# Patient Record
Sex: Female | Born: 2000
Health system: Southern US, Community
[De-identification: ages and names within clinical notes are randomized; demographics above are authoritative.]

## PROBLEM LIST (undated history)

## (undated) DIAGNOSIS — L0291 Cutaneous abscess, unspecified: Secondary | ICD-10-CM

## (undated) DIAGNOSIS — N83209 Unspecified ovarian cyst, unspecified side: Secondary | ICD-10-CM

## (undated) DIAGNOSIS — W19XXXA Unspecified fall, initial encounter: Secondary | ICD-10-CM

## (undated) DIAGNOSIS — N83201 Unspecified ovarian cyst, right side: Secondary | ICD-10-CM

## (undated) DIAGNOSIS — J45909 Unspecified asthma, uncomplicated: Secondary | ICD-10-CM

## (undated) DIAGNOSIS — N83202 Unspecified ovarian cyst, left side: Secondary | ICD-10-CM

## (undated) HISTORY — DX: Unspecified ovarian cyst, right side: N83.201

## (undated) HISTORY — DX: Unspecified ovarian cyst, left side: N83.202

## (undated) HISTORY — DX: Unspecified ovarian cyst, unspecified side: N83.209

## (undated) HISTORY — DX: Unspecified fall, initial encounter: W19.XXXA

---
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
## 2018-01-24 ENCOUNTER — Ambulatory Visit (INDEPENDENT_AMBULATORY_CARE_PROVIDER_SITE_OTHER): Payer: BLUE CROSS/BLUE SHIELD | Admitting: Adult Health

## 2018-01-24 ENCOUNTER — Encounter: Payer: Self-pay | Admitting: Adult Health

## 2018-01-24 VITALS — BP 111/67 | HR 95 | Ht 67.25 in | Wt 141.6 lb

## 2018-01-24 DIAGNOSIS — J351 Hypertrophy of tonsils: Secondary | ICD-10-CM | POA: Diagnosis not present

## 2018-01-24 DIAGNOSIS — Z0000 Encounter for general adult medical examination without abnormal findings: Secondary | ICD-10-CM

## 2018-01-24 DIAGNOSIS — J312 Chronic pharyngitis: Secondary | ICD-10-CM | POA: Insufficient documentation

## 2018-01-24 MED ORDER — ALBUTEROL SULFATE HFA 108 (90 BASE) MCG/ACT IN AERS: 2.0000 | INHALATION_SPRAY | Freq: Four times a day (QID) | RESPIRATORY_TRACT | 2 refills | Status: AC | PRN

## 2018-01-24 MED ORDER — BECLOMETHASONE DIPROPIONATE 80 MCG/ACT IN AERS: 2.0000 | INHALATION_SPRAY | Freq: Two times a day (BID) | RESPIRATORY_TRACT | 2 refills | Status: AC

## 2018-01-24 NOTE — Patient Instructions (Signed)
Preventive Care for Adults, Female  A healthy lifestyle and preventive care can promote health and wellness. Preventive health guidelines for women include the following key practices.   A routine yearly physical is a good way to check with your health care provider about your health and preventive screening. It is a chance to share any concerns and updates on your health and to receive a thorough exam.   Visit your dentist for a routine exam and preventive care every 6 months. Brush your teeth twice a day and floss once a day. Good oral hygiene prevents tooth decay and gum disease.   The frequency of eye exams is based on your age, health, family medical history, use of contact lenses, and other factors. Follow your health care provider's recommendations for frequency of eye exams.   Eat a healthy diet. Foods like vegetables, fruits, whole grains, low-fat dairy products, and lean protein foods contain the nutrients you need without too many calories. Decrease your intake of foods high in solid fats, added sugars, and salt. Eat the right amount of calories for you.Get information about a proper diet from your health care provider, if necessary.   Regular physical exercise is one of the most important things you can do for your health. Most adults should get at least 150 minutes of moderate-intensity exercise (any activity that increases your heart rate and causes you to sweat) each week. In addition, most adults need muscle-strengthening exercises on 2 or more days a week.   Maintain a healthy weight. The body mass index (BMI) is a screening tool to identify possible weight problems. It provides an estimate of body fat based on height and weight. Your health care provider can find your BMI, and can help you achieve or maintain a healthy weight.For adults 20 years and older:   - A BMI below 18.5 is considered underweight.   - A BMI of 18.5 to 24.9 is normal.   - A BMI of 25 to 29.9 is  considered overweight.   - A BMI of 30 and above is considered obese.   Maintain normal blood lipids and cholesterol levels by exercising and minimizing your intake of trans and saturated fats.  Eat a balanced diet with plenty of fruit and vegetables. Blood tests for lipids and cholesterol should begin at age 20 and be repeated every 5 years minimum.  If your lipid or cholesterol levels are high, you are over 40, or you are at high risk for heart disease, you may need your cholesterol levels checked more frequently.Ongoing high lipid and cholesterol levels should be treated with medicines if diet and exercise are not working.   If you smoke, find out from your health care provider how to quit. If you do not use tobacco, do not start.   Lung cancer screening is recommended for adults aged 55-80 years who are at high risk for developing lung cancer because of a history of smoking. A yearly low-dose CT scan of the lungs is recommended for people who have at least a 30-pack-year history of smoking and are a current smoker or have quit within the past 15 years. A pack year of smoking is smoking an average of 1 pack of cigarettes a day for 1 year (for example: 1 pack a day for 30 years or 2 packs a day for 15 years). Yearly screening should continue until the smoker has stopped smoking for at least 15 years. Yearly screening should be stopped for people who develop a   health problem that would prevent them from having lung cancer treatment.   If you are pregnant, do not drink alcohol. If you are breastfeeding, be very cautious about drinking alcohol. If you are not pregnant and choose to drink alcohol, do not have more than 1 drink per day. One drink is considered to be 12 ounces (355 mL) of beer, 5 ounces (148 mL) of wine, or 1.5 ounces (44 mL) of liquor.   Avoid use of street drugs. Do not share needles with anyone. Ask for help if you need support or instructions about stopping the use of  drugs.   High blood pressure causes heart disease and increases the risk of stroke. Your blood pressure should be checked at least yearly.  Ongoing high blood pressure should be treated with medicines if weight loss and exercise do not work.   If you are 69-55 years old, ask your health care provider if you should take aspirin to prevent strokes.   Diabetes screening involves taking a blood sample to check your fasting blood sugar level. This should be done once every 3 years, after age 38, if you are within normal weight and without risk factors for diabetes. Testing should be considered at a younger age or be carried out more frequently if you are overweight and have at least 1 risk factor for diabetes.   Breast cancer screening is essential preventive care for women. You should practice "breast self-awareness."  This means understanding the normal appearance and feel of your breasts and may include breast self-examination.  Any changes detected, no matter how small, should be reported to a health care provider.  Women in their 80s and 30s should have a clinical breast exam (CBE) by a health care provider as part of a regular health exam every 1 to 3 years.  After age 66, women should have a CBE every year.  Starting at age 1, women should consider having a mammogram (breast X-ray test) every year.  Women who have a family history of breast cancer should talk to their health care provider about genetic screening.  Women at a high risk of breast cancer should talk to their health care providers about having an MRI and a mammogram every year.   -Breast cancer gene (BRCA)-related cancer risk assessment is recommended for women who have family members with BRCA-related cancers. BRCA-related cancers include breast, ovarian, tubal, and peritoneal cancers. Having family members with these cancers may be associated with an increased risk for harmful changes (mutations) in the breast cancer genes BRCA1 and  BRCA2. Results of the assessment will determine the need for genetic counseling and BRCA1 and BRCA2 testing.   The Pap test is a screening test for cervical cancer. A Pap test can show cell changes on the cervix that might become cervical cancer if left untreated. A Pap test is a procedure in which cells are obtained and examined from the lower end of the uterus (cervix).   - Women should have a Pap test starting at age 57.   - Between ages 90 and 70, Pap tests should be repeated every 2 years.   - Beginning at age 63, you should have a Pap test every 3 years as long as the past 3 Pap tests have been normal.   - Some women have medical problems that increase the chance of getting cervical cancer. Talk to your health care provider about these problems. It is especially important to talk to your health care provider if a  new problem develops soon after your last Pap test. In these cases, your health care provider may recommend more frequent screening and Pap tests.   - The above recommendations are the same for women who have or have not gotten the vaccine for human papillomavirus (HPV).   - If you had a hysterectomy for a problem that was not cancer or a condition that could lead to cancer, then you no longer need Pap tests. Even if you no longer need a Pap test, a regular exam is a good idea to make sure no other problems are starting.   - If you are between ages 36 and 66 years, and you have had normal Pap tests going back 10 years, you no longer need Pap tests. Even if you no longer need a Pap test, a regular exam is a good idea to make sure no other problems are starting.   - If you have had past treatment for cervical cancer or a condition that could lead to cancer, you need Pap tests and screening for cancer for at least 20 years after your treatment.   - If Pap tests have been discontinued, risk factors (such as a new sexual partner) need to be reassessed to determine if screening should  be resumed.   - The HPV test is an additional test that may be used for cervical cancer screening. The HPV test looks for the virus that can cause the cell changes on the cervix. The cells collected during the Pap test can be tested for HPV. The HPV test could be used to screen women aged 70 years and older, and should be used in women of any age who have unclear Pap test results. After the age of 67, women should have HPV testing at the same frequency as a Pap test.   Colorectal cancer can be detected and often prevented. Most routine colorectal cancer screening begins at the age of 57 years and continues through age 26 years. However, your health care provider may recommend screening at an earlier age if you have risk factors for colon cancer. On a yearly basis, your health care provider may provide home test kits to check for hidden blood in the stool.  Use of a small camera at the end of a tube, to directly examine the colon (sigmoidoscopy or colonoscopy), can detect the earliest forms of colorectal cancer. Talk to your health care provider about this at age 23, when routine screening begins. Direct exam of the colon should be repeated every 5 -10 years through age 49 years, unless early forms of pre-cancerous polyps or small growths are found.   People who are at an increased risk for hepatitis B should be screened for this virus. You are considered at high risk for hepatitis B if:  -You were born in a country where hepatitis B occurs often. Talk with your health care provider about which countries are considered high risk.  - Your parents were born in a high-risk country and you have not received a shot to protect against hepatitis B (hepatitis B vaccine).  - You have HIV or AIDS.  - You use needles to inject street drugs.  - You live with, or have sex with, someone who has Hepatitis B.  - You get hemodialysis treatment.  - You take certain medicines for conditions like cancer, organ  transplantation, and autoimmune conditions.   Hepatitis C blood testing is recommended for all people born from [birth year] through 1965 and any individual  with known risks for hepatitis C.   Practice safe sex. Use condoms and avoid high-risk sexual practices to reduce the spread of sexually transmitted infections (STIs). STIs include gonorrhea, chlamydia, syphilis, trichomonas, herpes, HPV, and human immunodeficiency virus (HIV). Herpes, HIV, and HPV are viral illnesses that have no cure. They can result in disability, cancer, and death. Sexually active women aged 25 years and younger should be checked for chlamydia. Older women with new or multiple partners should also be tested for chlamydia. Testing for other STIs is recommended if you are sexually active and at increased risk.   Osteoporosis is a disease in which the bones lose minerals and strength with aging. This can result in serious bone fractures or breaks. The risk of osteoporosis can be identified using a bone density scan. Women ages 65 years and over and women at risk for fractures or osteoporosis should discuss screening with their health care providers. Ask your health care provider whether you should take a calcium supplement or vitamin D to There are also several preventive steps women can take to avoid osteoporosis and resulting fractures or to keep osteoporosis from worsening. -->Recommendations include:  Eat a balanced diet high in fruits, vegetables, calcium, and vitamins.  Get enough calcium. The recommended total intake of is 1,200 mg daily; for best absorption, if taking supplements, divide doses into 250-500 mg doses throughout the day. Of the two types of calcium, calcium carbonate is best absorbed when taken with food but calcium citrate can be taken on an empty stomach.  Get enough vitamin D. NAMS and the National Osteoporosis Foundation recommend at least 1,000 IU per day for women age 50 and over who are at risk of vitamin D  deficiency. Vitamin D deficiency can be caused by inadequate sun exposure (for example, those who live in northern latitudes).  Avoid alcohol and smoking. Heavy alcohol intake (more than 7 drinks per week) increases the risk of falls and hip fracture and women smokers tend to lose bone more rapidly and have lower bone mass than nonsmokers. Stopping smoking is one of the most important changes women can make to improve their health and decrease risk for disease.  Be physically active every day. Weight-bearing exercise (for example, fast walking, hiking, jogging, and weight training) may strengthen bones or slow the rate of bone loss that comes with aging. Balancing and muscle-strengthening exercises can reduce the risk of falling and fracture.  Consider therapeutic medications. Currently, several types of effective drugs are available. Healthcare providers can recommend the type most appropriate for each woman.  Eliminate environmental factors that may contribute to accidents. Falls cause nearly 90% of all osteoporotic fractures, so reducing this risk is an important bone-health strategy. Measures include ample lighting, removing obstructions to walking, using nonskid rugs on floors, and placing mats and/or grab bars in showers.  Be aware of medication side effects. Some common medicines make bones weaker. These include a type of steroid drug called glucocorticoids used for arthritis and asthma, some antiseizure drugs, certain sleeping pills, treatments for endometriosis, and some cancer drugs. An overactive thyroid gland or using too much thyroid hormone for an underactive thyroid can also be a problem. If you are taking these medicines, talk to your doctor about what you can do to help protect your bones.reduce the rate of osteoporosis.    Menopause can be associated with physical symptoms and risks. Hormone replacement therapy is available to decrease symptoms and risks. You should talk to your  health care provider   about whether hormone replacement therapy is right for you.   Use sunscreen. Apply sunscreen liberally and repeatedly throughout the day. You should seek shade when your shadow is shorter than you. Protect yourself by wearing long sleeves, pants, a wide-brimmed hat, and sunglasses year round, whenever you are outdoors.   Once a month, do a whole body skin exam, using a mirror to look at the skin on your back. Tell your health care provider of new moles, moles that have irregular borders, moles that are larger than a pencil eraser, or moles that have changed in shape or color.   -Stay current with required vaccines (immunizations).   Influenza vaccine. All adults should be immunized every year.  Tetanus, diphtheria, and acellular pertussis (Td, Tdap) vaccine. Pregnant women should receive 1 dose of Tdap vaccine during each pregnancy. The dose should be obtained regardless of the length of time since the last dose. Immunization is preferred during the 27th 36th week of gestation. An adult who has not previously received Tdap or who does not know her vaccine status should receive 1 dose of Tdap. This initial dose should be followed by tetanus and diphtheria toxoids (Td) booster doses every 10 years. Adults with an unknown or incomplete history of completing a 3-dose immunization series with Td-containing vaccines should begin or complete a primary immunization series including a Tdap dose. Adults should receive a Td booster every 10 years.  Varicella vaccine. An adult without evidence of immunity to varicella should receive 2 doses or a second dose if she has previously received 1 dose. Pregnant females who do not have evidence of immunity should receive the first dose after pregnancy. This first dose should be obtained before leaving the health care facility. The second dose should be obtained 4 8 weeks after the first dose.  Human papillomavirus (HPV) vaccine. Females aged 13 26  years who have not received the vaccine previously should obtain the 3-dose series. The vaccine is not recommended for use in pregnant females. However, pregnancy testing is not needed before receiving a dose. If a female is found to be pregnant after receiving a dose, no treatment is needed. In that case, the remaining doses should be delayed until after the pregnancy. Immunization is recommended for any person with an immunocompromised condition through the age of 26 years if she did not get any or all doses earlier. During the 3-dose series, the second dose should be obtained 4 8 weeks after the first dose. The third dose should be obtained 24 weeks after the first dose and 16 weeks after the second dose.  Zoster vaccine. One dose is recommended for adults aged 60 years or older unless certain conditions are present.  Measles, mumps, and rubella (MMR) vaccine. Adults born before 1957 generally are considered immune to measles and mumps. Adults born in 1957 or later should have 1 or more doses of MMR vaccine unless there is a contraindication to the vaccine or there is laboratory evidence of immunity to each of the three diseases. A routine second dose of MMR vaccine should be obtained at least 28 days after the first dose for students attending postsecondary schools, health care workers, or international travelers. People who received inactivated measles vaccine or an unknown type of measles vaccine during 1963 1967 should receive 2 doses of MMR vaccine. People who received inactivated mumps vaccine or an unknown type of mumps vaccine before 1979 and are at high risk for mumps infection should consider immunization with 2 doses of   MMR vaccine. For females of childbearing age, rubella immunity should be determined. If there is no evidence of immunity, females who are not pregnant should be vaccinated. If there is no evidence of immunity, females who are pregnant should delay immunization until after pregnancy.  Unvaccinated health care workers born before [birth year] who lack laboratory evidence of measles, mumps, or rubella immunity or laboratory confirmation of disease should consider measles and mumps immunization with 2 doses of MMR vaccine or rubella immunization with 1 dose of MMR vaccine.  Pneumococcal 13-valent conjugate (PCV13) vaccine. When indicated, a person who is uncertain of her immunization history and has no record of immunization should receive the PCV13 vaccine. An adult aged 54 years or older who has certain medical conditions and has not been previously immunized should receive 1 dose of PCV13 vaccine. This PCV13 should be followed with a dose of pneumococcal polysaccharide (PPSV23) vaccine. The PPSV23 vaccine dose should be obtained at least 8 weeks after the dose of PCV13 vaccine. An adult aged 58 years or older who has certain medical conditions and previously received 1 or more doses of PPSV23 vaccine should receive 1 dose of PCV13. The PCV13 vaccine dose should be obtained 1 or more years after the last PPSV23 vaccine dose.  Pneumococcal polysaccharide (PPSV23) vaccine. When PCV13 is also indicated, PCV13 should be obtained first. All adults aged 58 years and older should be immunized. An adult younger than age 65 years who has certain medical conditions should be immunized. Any person who resides in a nursing home or long-term care facility should be immunized. An adult smoker should be immunized. People with an immunocompromised condition and certain other conditions should receive both PCV13 and PPSV23 vaccines. People with human immunodeficiency virus (HIV) infection should be immunized as soon as possible after diagnosis. Immunization during chemotherapy or radiation therapy should be avoided. Routine use of PPSV23 vaccine is not recommended for American Indians, [place] Natives, or people younger than 65 years unless there are medical conditions that require PPSV23 vaccine. When indicated,  people who have unknown immunization and have no record of immunization should receive PPSV23 vaccine. One-time revaccination 5 years after the first dose of PPSV23 is recommended for people aged 70 64 years who have chronic kidney failure, nephrotic syndrome, asplenia, or immunocompromised conditions. People who received 1 2 doses of PPSV23 before age 32 years should receive another dose of PPSV23 vaccine at age 96 years or later if at least 5 years have passed since the previous dose. Doses of PPSV23 are not needed for people immunized with PPSV23 at or after age 55 years.  Meningococcal vaccine. Adults with asplenia or persistent complement component deficiencies should receive 2 doses of quadrivalent meningococcal conjugate (MenACWY-D) vaccine. The doses should be obtained at least 2 months apart. Microbiologists working with certain meningococcal bacteria, [employer] recruits, people at risk during an outbreak, and people who travel to or live in countries with a high rate of meningitis should be immunized. A first-year college student up through age 58 years who is living in a residence hall should receive a dose if she did not receive a dose on or after her 16th birthday. Adults who have certain high-risk conditions should receive one or more doses of vaccine.  Hepatitis A vaccine. Adults who wish to be protected from this disease, have certain high-risk conditions, work with hepatitis A-infected animals, work in hepatitis A research labs, or travel to or work in countries with a high rate of hepatitis A should be  immunized. Adults who were previously unvaccinated and who anticipate close contact with an international adoptee during the first 60 days after arrival in the [place] from a country with a high rate of hepatitis A should be immunized.  Hepatitis B vaccine.  Adults who wish to be protected from this disease, have certain high-risk conditions, may be exposed to blood or other infectious  body fluids, are household contacts or sex partners of hepatitis B positive people, are clients or workers in certain care facilities, or travel to or work in countries with a high rate of hepatitis B should be immunized.  Haemophilus influenzae type b (Hib) vaccine. A previously unvaccinated person with asplenia or sickle cell disease or having a scheduled splenectomy should receive 1 dose of Hib vaccine. Regardless of previous immunization, a recipient of a hematopoietic stem cell transplant should receive a 3-dose series 6 12 months after her successful transplant. Hib vaccine is not recommended for adults with HIV infection.  Preventive Services / Frequency Ages 6 to 39years  Blood pressure check.** / Every 1 to 2 years.  Lipid and cholesterol check.** / Every 5 years beginning at age 39.  Clinical breast exam.** / Every 3 years for women in their 61s and 62s.  BRCA-related cancer risk assessment.** / For women who have family members with a BRCA-related cancer (breast, ovarian, tubal, or peritoneal cancers).  Pap test.** / Every 2 years from ages 47 through 85. Every 3 years starting at age 34 through age 12 or 74 with a history of 3 consecutive normal Pap tests.  HPV screening.** / Every 3 years from ages 46 through ages 43 to 54 with a history of 3 consecutive normal Pap tests.  Hepatitis C blood test.** / For any individual with known risks for hepatitis C.  Skin self-exam. / Monthly.  Influenza vaccine. / Every year.  Tetanus, diphtheria, and acellular pertussis (Tdap, Td) vaccine.** / Consult your health care provider. Pregnant women should receive 1 dose of Tdap vaccine during each pregnancy. 1 dose of Td every 10 years.  Varicella vaccine.** / Consult your health care provider. Pregnant females who do not have evidence of immunity should receive the first dose after pregnancy.  HPV vaccine. / 3 doses over 6 months, if 64 and younger. The vaccine is not recommended for use in  pregnant females. However, pregnancy testing is not needed before receiving a dose.  Measles, mumps, rubella (MMR) vaccine.** / You need at least 1 dose of MMR if you were born in 1957 or later. You may also need a 2nd dose. For females of childbearing age, rubella immunity should be determined. If there is no evidence of immunity, females who are not pregnant should be vaccinated. If there is no evidence of immunity, females who are pregnant should delay immunization until after pregnancy.  Pneumococcal 13-valent conjugate (PCV13) vaccine.** / Consult your health care provider.  Pneumococcal polysaccharide (PPSV23) vaccine.** / 1 to 2 doses if you smoke cigarettes or if you have certain conditions.  Meningococcal vaccine.** / 1 dose if you are age 71 to 35 years and a Market researcher living in a residence hall, or have one of several medical conditions, you need to get vaccinated against meningococcal disease. You may also need additional booster doses.  Hepatitis A vaccine.** / Consult your health care provider.  Hepatitis B vaccine.** / Consult your health care provider.  Haemophilus influenzae type b (Hib) vaccine.** / Consult your health care provider.  Ages 55 to 64years  Blood pressure check.** / Every 1 to 2 years.  Lipid and cholesterol check.** / Every 5 years beginning at age 20 years.  Lung cancer screening. / Every year if you are aged 55 80 years and have a 30-pack-year history of smoking and currently smoke or have quit within the past 15 years. Yearly screening is stopped once you have quit smoking for at least 15 years or develop a health problem that would prevent you from having lung cancer treatment.  Clinical breast exam.** / Every year after age 40 years.  BRCA-related cancer risk assessment.** / For women who have family members with a BRCA-related cancer (breast, ovarian, tubal, or peritoneal cancers).  Mammogram.** / Every year beginning at age 40  years and continuing for as long as you are in good health. Consult with your health care provider.  Pap test.** / Every 3 years starting at age 30 years through age 65 or 70 years with a history of 3 consecutive normal Pap tests.  HPV screening.** / Every 3 years from ages 30 years through ages 65 to 70 years with a history of 3 consecutive normal Pap tests.  Fecal occult blood test (FOBT) of stool. / Every year beginning at age 50 years and continuing until age 75 years. You may not need to do this test if you get a colonoscopy every 10 years.  Flexible sigmoidoscopy or colonoscopy.** / Every 5 years for a flexible sigmoidoscopy or every 10 years for a colonoscopy beginning at age 50 years and continuing until age 75 years.  Hepatitis C blood test.** / For all people born from 1945 through 1965 and any individual with known risks for hepatitis C.  Skin self-exam. / Monthly.  Influenza vaccine. / Every year.  Tetanus, diphtheria, and acellular pertussis (Tdap/Td) vaccine.** / Consult your health care provider. Pregnant women should receive 1 dose of Tdap vaccine during each pregnancy. 1 dose of Td every 10 years.  Varicella vaccine.** / Consult your health care provider. Pregnant females who do not have evidence of immunity should receive the first dose after pregnancy.  Zoster vaccine.** / 1 dose for adults aged 60 years or older.  Measles, mumps, rubella (MMR) vaccine.** / You need at least 1 dose of MMR if you were born in 1957 or later. You may also need a 2nd dose. For females of childbearing age, rubella immunity should be determined. If there is no evidence of immunity, females who are not pregnant should be vaccinated. If there is no evidence of immunity, females who are pregnant should delay immunization until after pregnancy.  Pneumococcal 13-valent conjugate (PCV13) vaccine.** / Consult your health care provider.  Pneumococcal polysaccharide (PPSV23) vaccine.** / 1 to 2 doses if  you smoke cigarettes or if you have certain conditions.  Meningococcal vaccine.** / Consult your health care provider.  Hepatitis A vaccine.** / Consult your health care provider.  Hepatitis B vaccine.** / Consult your health care provider.  Haemophilus influenzae type b (Hib) vaccine.** / Consult your health care provider.  Ages 65 years and over  Blood pressure check.** / Every 1 to 2 years.  Lipid and cholesterol check.** / Every 5 years beginning at age 20 years.  Lung cancer screening. / Every year if you are aged 55 80 years and have a 30-pack-year history of smoking and currently smoke or have quit within the past 15 years. Yearly screening is stopped once you have quit smoking for at least 15 years or develop a health problem that   would prevent you from having lung cancer treatment.  Clinical breast exam.** / Every year after age 103 years.  BRCA-related cancer risk assessment.** / For women who have family members with a BRCA-related cancer (breast, ovarian, tubal, or peritoneal cancers).  Mammogram.** / Every year beginning at age 36 years and continuing for as long as you are in good health. Consult with your health care provider.  Pap test.** / Every 3 years starting at age 5 years through age 85 or 10 years with 3 consecutive normal Pap tests. Testing can be stopped between 65 and 70 years with 3 consecutive normal Pap tests and no abnormal Pap or HPV tests in the past 10 years.  HPV screening.** / Every 3 years from ages 93 years through ages 70 or 45 years with a history of 3 consecutive normal Pap tests. Testing can be stopped between 65 and 70 years with 3 consecutive normal Pap tests and no abnormal Pap or HPV tests in the past 10 years.  Fecal occult blood test (FOBT) of stool. / Every year beginning at age 8 years and continuing until age 45 years. You may not need to do this test if you get a colonoscopy every 10 years.  Flexible sigmoidoscopy or colonoscopy.** /  Every 5 years for a flexible sigmoidoscopy or every 10 years for a colonoscopy beginning at age 69 years and continuing until age 68 years.  Hepatitis C blood test.** / For all people born from [birth year] through 1967 and any individual with known risks for hepatitis C.  Osteoporosis screening.** / A one-time screening for women ages 7 years and over and women at risk for fractures or osteoporosis.  Skin self-exam. / Monthly.  Influenza vaccine. / Every year.  Tetanus, diphtheria, and acellular pertussis (Tdap/Td) vaccine.** / 1 dose of Td every 10 years.  Varicella vaccine.** / Consult your health care provider.  Zoster vaccine.** / 1 dose for adults aged 5 years or older.  Pneumococcal 13-valent conjugate (PCV13) vaccine.** / Consult your health care provider.  Pneumococcal polysaccharide (PPSV23) vaccine.** / 1 dose for all adults aged 74 years and older.  Meningococcal vaccine.** / Consult your health care provider.  Hepatitis A vaccine.** / Consult your health care provider.  Hepatitis B vaccine.** / Consult your health care provider.  Haemophilus influenzae type b (Hib) vaccine.** / Consult your health care provider. ** Family history and personal history of risk and conditions may change your health care provider's recommendations. Document Released: 07/18/2001 Document Revised: 03/12/2013  [facility] Patient Information 2014 [place], [place].   EXERCISE AND DIET:  We recommended that you start or continue a regular exercise program for good health. Regular exercise means any activity that makes your heart beat faster and makes you sweat.  We recommend exercising at least 30 minutes per day at least 3 days a week, preferably 5.  We also recommend a diet low in fat and sugar / carbohydrates.  Inactivity, poor dietary choices and obesity can cause diabetes, heart attack, stroke, and kidney damage, among others.     ALCOHOL AND SMOKING:  Women should limit their alcohol intake to no  more than 7 drinks/beers/glasses of wine (combined, not each!) per week. Moderation of alcohol intake to this level decreases your risk of breast cancer and liver damage.  ( And of course, no recreational drugs are part of a healthy lifestyle.)  Also, you should not be smoking at all or even being exposed to second hand smoke. Most people know smoking can  cause cancer, and various heart and lung diseases, but did you know it also contributes to weakening of your bones?  Aging of your skin?  Yellowing of your teeth and nails?   CALCIUM AND VITAMIN D:  Adequate intake of calcium and Vitamin D are recommended.  The recommendations for exact amounts of these supplements seem to change often, but generally speaking 600 mg of calcium (either carbonate or citrate) and 800 units of Vitamin D per day seems prudent. Certain women may benefit from higher intake of Vitamin D.  If you are among these women, your doctor will have told you during your visit.     PAP SMEARS:  Pap smears, to check for cervical cancer or precancers,  have traditionally been done yearly, although recent scientific advances have shown that most women can have pap smears less often.  However, every woman still should have a physical exam from her gynecologist or primary care physician every year. It will include a breast check, inspection of the vulva and vagina to check for abnormal growths or skin changes, a visual exam of the cervix, and then an exam to evaluate the size and shape of the uterus and ovaries.  And after 19 years of age, a rectal exam is indicated to check for rectal cancers. We will also provide age appropriate advice regarding health maintenance, like when you should have certain vaccines, screening for sexually transmitted diseases, bone density testing, colonoscopy, mammograms, etc.    MAMMOGRAMS:  All women over 65 years old should have a yearly mammogram. Many facilities now offer a "3D" mammogram, which may cost  around $50 extra out of pocket. If possible,  we recommend you accept the option to have the 3D mammogram performed.  It both reduces the number of women who will be called back for extra views which then turn out to be normal, and it is better than the routine mammogram at detecting truly abnormal areas.     COLONOSCOPY:  Colonoscopy to screen for colon cancer is recommended for all women at age 26.  We know, you hate the idea of the prep.  We agree, BUT, having colon cancer and not knowing it is worse!!  Colon cancer so often starts as a polyp that can be seen and removed at colonscopy, which can quite literally save your life!  And if your first colonoscopy is normal and you have no family history of colon cancer, most women don't have to have it again for 10 years.  Once every ten years, you can do something that may end up saving your life, right?  We will be happy to help you get it scheduled when you are ready.  Be sure to check your insurance coverage so you understand how much it will cost.  It may be covered as a preventative service at no cost, but you should check your particular policy.    Mediterranean Diet A Mediterranean diet refers to food and lifestyle choices that are based on the traditions of countries located on the [employer]. This way of eating has been shown to help prevent certain conditions and improve outcomes for people who have chronic diseases, like kidney disease and heart disease. What are tips for following this plan? Lifestyle  Cook and eat meals together with your family, when possible.  Drink enough fluid to keep your urine clear or pale yellow.  Be physically active every day. This includes: ? Aerobic exercise like running or swimming. ? Leisure activities like  gardening, walking, or housework.  Get 7-8 hours of sleep each night.  If recommended by your health care provider, drink red wine in moderation. This means 1 glass a day for nonpregnant women  and 2 glasses a day for men. A glass of wine equals 5 oz (150 mL). Reading food labels  Check the serving size of packaged foods. For foods such as rice and pasta, the serving size refers to the amount of cooked product, not dry.  Check the total fat in packaged foods. Avoid foods that have saturated fat or trans fats.  Check the ingredients list for added sugars, such as corn syrup. Shopping  At the grocery store, buy most of your food from the areas near the walls of the store. This includes: ? Fresh fruits and vegetables (produce). ? Grains, beans, nuts, and seeds. Some of these may be available in unpackaged forms or large amounts (in bulk). ? Fresh seafood. ? Poultry and eggs. ? Low-fat dairy products.  Buy whole ingredients instead of prepackaged foods.  Buy fresh fruits and vegetables in-season from local farmers markets.  Buy frozen fruits and vegetables in resealable bags.  If you do not have access to quality fresh seafood, buy precooked frozen shrimp or canned fish, such as tuna, salmon, or sardines.  Buy small amounts of raw or cooked vegetables, salads, or olives from the deli or salad bar at your store.  Stock your pantry so you always have certain foods on hand, such as olive oil, canned tuna, canned tomatoes, rice, pasta, and beans. Cooking  Cook foods with extra-virgin olive oil instead of using butter or other vegetable oils.  Have meat as a side dish, and have vegetables or grains as your main dish. This means having meat in small portions or adding small amounts of meat to foods like pasta or stew.  Use beans or vegetables instead of meat in common dishes like chili or lasagna.  Experiment with different cooking methods. Try roasting or broiling vegetables instead of steaming or sauteing them.  Add frozen vegetables to soups, stews, pasta, or rice.  Add nuts or seeds for added healthy fat at each meal. You can add these to yogurt, salads, or vegetable  dishes.  Marinate fish or vegetables using olive oil, lemon juice, garlic, and fresh herbs. Meal planning  Plan to eat 1 vegetarian meal one day each week. Try to work up to 2 vegetarian meals, if possible.  Eat seafood 2 or more times a week.  Have healthy snacks readily available, such as: ? Vegetable sticks with hummus. ? [place] yogurt. ? Fruit and nut trail mix.  Eat balanced meals throughout the week. This includes: ? Fruit: 2-3 servings a day ? Vegetables: 4-5 servings a day ? Low-fat dairy: 2 servings a day ? Fish, poultry, or lean meat: 1 serving a day ? Beans and legumes: 2 or more servings a week ? Nuts and seeds: 1-2 servings a day ? Whole grains: 6-8 servings a day ? Extra-virgin olive oil: 3-4 servings a day  Limit red meat and sweets to only a few servings a month What are my food choices?  Mediterranean diet ? Recommended ? Grains: Whole-grain pasta. Brown rice. Bulgar wheat. Polenta. Couscous. Whole-wheat bread. [place]. ? Vegetables: Artichokes. Beets. Broccoli. Cabbage. Carrots. Eggplant. Green beans. Chard. Kale. Spinach. Onions. Leeks. Peas. Squash. Tomatoes. Peppers. Radishes. ? Fruits: Apples. Apricots. Avocado. Berries. Bananas. Cherries. Dates. Figs. Grapes. Lemons. Melon. Oranges. Peaches. Plums. Pomegranate. ? Meats and other protein  foods: Beans. Almonds. Sunflower seeds. Pine nuts. Peanuts. [place]. Salmon. Scallops. Shrimp. [place]. Tilapia. Clams. Oysters. Eggs. ? Dairy: Low-fat milk. Cheese. Greek yogurt. ? Beverages: Water. Red wine. Herbal tea. ? Fats and oils: Extra virgin olive oil. Avocado oil. Grape seed oil. ? Sweets and desserts: [place] yogurt with honey. Baked apples. Poached pears. Trail mix. ? Seasoning and other foods: Basil. Cilantro. Coriander. Cumin. Mint. Parsley. Sage. Rosemary. Tarragon. Garlic. Oregano. Thyme. Pepper. Balsalmic vinegar. Tahini. Hummus. Tomato sauce. Olives. Mushrooms. ? Limit these ? Grains: Prepackaged pasta or  rice dishes. Prepackaged cereal with added sugar. ? Vegetables: Deep fried potatoes (french fries). ? Fruits: Fruit canned in syrup. ? Meats and other protein foods: Beef. Pork. Lamb. Poultry with skin. Hot dogs. [place] Salines. ? Dairy: Ice cream. Sour cream. Whole milk. ? Beverages: Juice. Sugar-sweetened soft drinks. Beer. Liquor and spirits. ? Fats and oils: Butter. Canola oil. Vegetable oil. Beef fat (tallow). Lard. ? Sweets and desserts: Cookies. Cakes. Pies. Candy. ? Seasoning and other foods: Mayonnaise. Premade sauces and marinades. ? The items listed may not be a complete list. Talk with your dietitian about what dietary choices are right for you. Summary  The Mediterranean diet includes both food and lifestyle choices.  Eat a variety of fresh fruits and vegetables, beans, nuts, seeds, and whole grains.  Limit the amount of red meat and sweets that you eat.  Talk with your health care provider about whether it is safe for you to drink red wine in moderation. This means 1 glass a day for nonpregnant women and 2 glasses a day for men. A glass of wine equals 5 oz (150 mL). This information is not intended to replace advice given to you by your health care provider. Make sure you discuss any questions you have with your health care provider. Document Released: 01/13/2016 Document Revised: 02/15/2016 Document Reviewed: 01/13/2016 Else[employer] Interactive Patient Education  2018 [place] to drink plenty of water, follow Mediterranean diet, and exercise 87mns at least 5 days a week. Refills on inhalers sent to pharmacy in [place]. Recommend annual physical. HAVE A GREAT [employer] YEAR AT SCHOOL! NICE TO SEE YOU!

## 2018-01-24 NOTE — Progress Notes (Signed)
Subjective:    Patient ID: Annette Brown, female    DOB: 06-07-2000, 17 y.o.   MRN: [id number]  HPI: 01/22/18 OV:  Annette Brown is here to establish as a new pt.  She is a very pleasant 17 year old female.   PMH:  Asthma, atypical migraine HA.  During swim season she needs QVAR inhaler almost daily and rescue inhaler once every few weeks.  She denies tobacco/EOTH use.  Her HAs start with visual aura and then nausea that will last for days.  She reports that he mother and maternal side relatives have HAs similar in nature.  She denies CP/dyspnea at rest/palpitations.  She is not sexually active.  She will be starting college this fall to study acting. She feels that her menses have intensified since she has stopped strenuous exercising/training for swim season. She has never been on oral birth control, but would like to discuss starting rx in few months if menses do not improve.  01/24/2018 OV: Annette Brown is here for CPE She hd IUD placed May 2019 She had f/u with OB/GYN in June and denies current complications with IUD She estimates to drink >50 oz water a day and has reduced saturated fat/sugar from diet, she has lost 6 lbs since June 2019 She plans on re-starting cardio and wt training when she returns to school next week at [employer]- sophomore year. She denies tobacco/ETOH/illicit drug use. She denies acute complaints/issues today Of Note- She has had chronic pharyngitis that has been strep neg  She reports her tonsils have "been huge my whole life"  Healthcare Maintenance: PAP-N/A Immunizations-UTD  Patient Care Team    Relationship Specialty Notifications Start End  Abeni Finchum, Jinny Blossom, NP PCP - General Family Medicine  01/22/17     Patient Active Problem List   Diagnosis Date Noted  . Enlarged tonsils 01/24/2018  . Pharyngitis, chronic 01/24/2018  . Atypical migraine 10/07/2017  . Chronic left shoulder pain 05/24/2017  . Screening for tuberculosis 01/22/2017  .  Healthcare maintenance 01/22/2017     Past Medical History:  Diagnosis Date  . Bilateral ovarian cysts      History reviewed. No pertinent surgical history.   Family History  Problem Relation Age of Onset  . Hyperlipidemia Mother   . Hypertension Mother   . Ovarian cysts Mother   . Hyperlipidemia Paternal Uncle   . Hyperlipidemia Maternal Grandmother   . Stroke Maternal Grandmother   . Cancer Paternal Grandfather        brain and prostate  . Stroke Paternal Grandfather      Social History   Substance and Sexual Activity  Drug Use No     Social History   Substance and Sexual Activity  Alcohol Use No     Social History   Tobacco Use  Smoking Status Never Smoker  Smokeless Tobacco Never Used     Outpatient Encounter Medications as of 01/24/2018  Medication Sig  . albuterol (PROVENTIL HFA;VENTOLIN HFA) 108 (90 Base) MCG/ACT inhaler Inhale 2 puffs into the lungs every 6 (six) hours as needed for wheezing or shortness of breath.  . beclomethasone (QVAR) 80 MCG/ACT inhaler Inhale 2 puffs into the lungs 2 (two) times daily.  . cyclobenzaprine (FLEXERIL) 10 MG tablet Take 10 mg by mouth 3 (three) times daily as needed for muscle spasms.  Marland Kitchen HYDROcodone-acetaminophen (NORCO/VICODIN) 5-325 MG tablet Take 1 tablet by mouth every 6 (six) hours as needed.  . loratadine (CLARITIN) 10 MG tablet Take 10  mg by mouth daily as needed for allergies.  . mometasone (ELOCON) 0.1 % cream Apply 1 application topically daily as needed (rash).  . [DISCONTINUED] albuterol (PROVENTIL HFA;VENTOLIN HFA) 108 (90 Base) MCG/ACT inhaler Inhale 2 puffs into the lungs every 6 (six) hours as needed for wheezing or shortness of breath.  . [DISCONTINUED] beclomethasone (QVAR) 80 MCG/ACT inhaler Inhale 2 puffs into the lungs 2 (two) times daily.   . [DISCONTINUED] cyclobenzaprine (FLEXERIL) 10 MG tablet Take 1 tablet (10 mg total) by mouth at bedtime. (Patient not taking: Reported on 11/11/2017)   No  facility-administered encounter medications on file as of 01/24/2018.     Allergies: Patient has no known allergies.  Body mass index is 22.01 kg/m.  Blood pressure 111/67, pulse 95, height 5' 7.25" (1.708 m), weight 141 lb 9.6 oz (64.2 kg), last menstrual period 01/17/2018, SpO2 99 %.  Review of Systems  Constitutional: Positive for fatigue. Negative for activity change, appetite change, chills, diaphoresis, fever and unexpected weight change.  HENT: Negative for congestion.   Eyes: Negative for visual disturbance.  Respiratory: Negative for cough, chest tightness, shortness of breath, wheezing and stridor.   Cardiovascular: Negative for chest pain, palpitations and leg swelling.  Gastrointestinal: Negative for abdominal distention, abdominal pain, blood in stool, constipation, diarrhea, nausea and vomiting.  Endocrine: Negative for cold intolerance, heat intolerance, polydipsia, polyphagia and polyuria.  Genitourinary: Negative for difficulty urinating and flank pain.  Musculoskeletal: Negative for arthralgias, back pain, gait problem, joint swelling, myalgias, neck pain and neck stiffness.  Skin: Negative for color change, pallor, rash and wound.  Neurological: Positive for headaches. Negative for dizziness, tremors and weakness.  Hematological: Does not bruise/bleed easily.  Psychiatric/Behavioral: Negative for dysphoric mood, hallucinations, self-injury, sleep disturbance and suicidal ideas. The patient is not nervous/anxious and is not hyperactive.       Objective:   Physical Exam  Constitutional: She is oriented to person, place, and time. She appears well-developed and well-nourished. No distress.  HENT:  Head: Normocephalic and atraumatic.  Right Ear: Hearing, tympanic membrane, external ear and ear canal normal. Tympanic membrane is not erythematous and not bulging. No decreased hearing is noted.  Left Ear: Hearing, tympanic membrane, external ear and ear canal normal.  Tympanic membrane is not erythematous and not bulging. No decreased hearing is noted.  Nose: Nose normal. No rhinorrhea. Right sinus exhibits no maxillary sinus tenderness and no frontal sinus tenderness. Left sinus exhibits no maxillary sinus tenderness and no frontal sinus tenderness.  Mouth/Throat: Uvula is midline and mucous membranes are normal. Posterior oropharyngeal erythema present. No oropharyngeal exudate, posterior oropharyngeal edema or tonsillar abscesses.  Very enlarged tonsils Uvula deviation to R  Eyes: Pupils are equal, round, and reactive to light. Conjunctivae and EOM are normal.  Neck: Normal range of motion. Neck supple.  Cardiovascular: Normal rate, regular rhythm, normal heart sounds and intact distal pulses.  No murmur heard. Pulmonary/Chest: Effort normal and breath sounds normal. No respiratory distress. She has no decreased breath sounds. She has no wheezes. She has no rhonchi. She has no rales. She exhibits no tenderness. Right breast exhibits no inverted nipple, no mass, no nipple discharge, no skin change and no tenderness. Left breast exhibits no inverted nipple, no mass, no nipple discharge, no skin change and no tenderness.  Abdominal: Soft. Bowel sounds are normal. She exhibits no distension and no mass. There is no tenderness. There is no rebound and no guarding.  Genitourinary:  Genitourinary Comments: Pelvic not completed   Musculoskeletal:  Normal range of motion. She exhibits no edema or tenderness.  Lymphadenopathy:    She has no cervical adenopathy.  Neurological: She is alert and oriented to person, place, and time. Coordination normal.  Skin: Skin is warm and dry. No rash noted. She is not diaphoretic. No erythema. No pallor.  Psychiatric: She has a normal mood and affect. Her behavior is normal. Judgment and thought content normal.  Nursing note and vitals reviewed.     Assessment & Plan:   1. Healthcare maintenance   2. Enlarged tonsils   3.  Pharyngitis, chronic     Healthcare maintenance Continue to drink plenty of water, follow Mediterranean diet, and exercise 30mins at least 5 days a week. Refills on inhalers sent to pharmacy in [place]. Recommend annual physical.  Enlarged tonsils Referral to ENT placed    FOLLOW-UP:  Return in about 1 year (around 01/25/2019) for CPE.

## 2018-01-24 NOTE — Assessment & Plan Note (Signed)
Continue to drink plenty of water, follow Mediterranean diet, and exercise at least 5 days a week. Refills on inhalers sent to pharmacy in [place]. Recommend annual physical.

## 2018-01-24 NOTE — Assessment & Plan Note (Signed)
Referral to ENT placed

## 2018-05-27 ENCOUNTER — Ambulatory Visit (INDEPENDENT_AMBULATORY_CARE_PROVIDER_SITE_OTHER): Payer: BLUE CROSS/BLUE SHIELD | Admitting: Adult Health

## 2018-05-27 ENCOUNTER — Encounter: Payer: Self-pay | Admitting: Adult Health

## 2018-05-27 VITALS — BP 110/75 | HR 113 | Temp 98.4°F | Ht 67.25 in | Wt 144.3 lb

## 2018-05-27 DIAGNOSIS — Z0000 Encounter for general adult medical examination without abnormal findings: Secondary | ICD-10-CM | POA: Diagnosis not present

## 2018-05-27 DIAGNOSIS — L749 Eccrine sweat disorder, unspecified: Secondary | ICD-10-CM | POA: Diagnosis not present

## 2018-05-27 DIAGNOSIS — G8929 Other chronic pain: Secondary | ICD-10-CM

## 2018-05-27 DIAGNOSIS — M25562 Pain in left knee: Secondary | ICD-10-CM | POA: Diagnosis not present

## 2018-05-27 DIAGNOSIS — M25512 Pain in left shoulder: Secondary | ICD-10-CM

## 2018-05-27 DIAGNOSIS — R000 Tachycardia, unspecified: Secondary | ICD-10-CM | POA: Diagnosis not present

## 2018-05-27 MED ORDER — MELOXICAM 7.5 MG PO TABS: 7.5000 mg | ORAL_TABLET | Freq: Every day | ORAL | 0 refills | Status: DC

## 2018-05-27 MED ORDER — CYCLOBENZAPRINE HCL 10 MG PO TABS: 10.0000 mg | ORAL_TABLET | Freq: Three times a day (TID) | ORAL | 1 refills | Status: AC | PRN

## 2018-05-27 NOTE — Patient Instructions (Addendum)
Acute Knee Pain, Adult Acute knee pain is sudden and may be caused by damage, swelling, or irritation of the muscles and tissues that support your knee. The injury may result from:  A fall.  An injury to your knee from twisting motions.  A hit to the knee.  Infection. Acute knee pain may go away on its own with time and rest. If it does not, your health care provider may order tests to find the cause of the pain. These may include:  Imaging tests, such as an X-ray, MRI, or ultrasound.  Joint aspiration. In this test, fluid is removed from the knee.  Arthroscopy. In this test, a lighted tube is inserted into the knee and an image is projected onto a TV screen.  Biopsy. In this test, a sample of tissue is removed from the body and studied under a microscope. Follow these instructions at home: Pay attention to any changes in your symptoms. Take these actions to relieve your pain. If you have a knee sleeve or brace:   Wear the sleeve or brace as told by your health care provider. Remove it only as told by your health care provider.  Loosen the sleeve or brace if your toes tingle, become numb, or turn cold and blue.  Keep the sleeve or brace clean.  If the sleeve or brace is not waterproof: ? Do not let it get wet. ? Cover it with a watertight covering when you take a bath or shower. Activity  Rest your knee.  Do not do things that cause pain or make pain worse.  Avoid high-impact activities or exercises, such as running, jumping rope, or doing jumping jacks.  Work with a physical therapist to make a safe exercise program, as recommended by your health care provider. Do exercises as told by your physical therapist. Managing pain, stiffness, and swelling   If directed, put ice on the knee: ? Put ice in a plastic bag. ? Place a towel between your skin and the bag. ? Leave the ice on for 20 minutes, 2-3 times a day.  If directed, use an elastic bandage to put pressure  (compression) on your injured knee. This may control swelling, give support, and help with discomfort. General instructions  Take over-the-counter and prescription medicines only as told by your health care provider.  Raise (elevate) your knee above the level of your heart when you are sitting or lying down.  Sleep with a pillow under your knee.  Do not use any products that contain nicotine or tobacco, such as cigarettes, e-cigarettes, and chewing tobacco. These can delay healing. If you need help quitting, ask your health care provider.  If you are overweight, work with your health care provider and a dietitian to set a weight-loss goal that is healthy and reasonable for you. Extra weight can put pressure on your knee.  Keep all follow-up visits as told by your health care provider. This is important. Contact a health care provider if:  Your knee pain continues, changes, or gets worse.  You have a fever along with knee pain.  Your knee feels warm to the touch.  Your knee buckles or locks up. Get help right away if:  Your knee swells, and the swelling becomes worse.  You cannot move your knee.  You have severe pain in your knee. Summary  Acute knee pain can be caused by a fall, an injury, an infection, or damage, swelling, or irritation of the tissues that support your knee.    Your health care provider may perform tests to find out the cause of the pain.  Pay attention to any changes in your symptoms. Relieve your pain with rest, medicines, light activity, and use of ice.  Get help if your pain continues or becomes worse, your knee swells, or you cannot move your knee. This information is not intended to replace advice given to you by your health care provider. Make sure you discuss any questions you have with your health care provider. Document Released: 03/19/2007 Document Revised: 11/01/2017 Document Reviewed: 11/01/2017 Elsevier Interactive Patient Education  2019  Elsevier Inc[employer]   Shoulder Pain Many things can cause shoulder pain, including:  An injury to the shoulder.  Overuse of the shoulder.  Arthritis. The source of the pain can be:  Inflammation.  An injury to the shoulder joint.  An injury to a tendon, ligament, or bone. Follow these instructions at home: Pay attention to changes in your symptoms. Let your health care provider know about them. Follow these instructions to relieve your pain. If you have a sling:  Wear the sling as told by your health care provider. Remove it only as told by your health care provider.  Loosen the sling if your fingers tingle, become numb, or turn cold and blue.  Keep the sling clean.  If the sling is not waterproof: ? Do not let it get wet. Remove it to shower or bathe.  Move your arm as little as possible, but keep your hand moving to prevent swelling. Managing pain, stiffness, and swelling   If directed, put ice on the painful area: ? Put ice in a plastic bag. ? Place a towel between your skin and the bag. ? Leave the ice on for 20 minutes, 2-3 times per day. Stop applying ice if it does not help with the pain.  Squeeze a soft ball or a foam pad as much as possible. This helps to keep the shoulder from swelling. It also helps to strengthen the arm. General instructions  Take over-the-counter and prescription medicines only as told by your health care provider.  Keep all follow-up visits as told by your health care provider. This is important. Contact a health care provider if:  Your pain gets worse.  Your pain is not relieved with medicines.  New pain develops in your arm, hand, or fingers. Get help right away if:  Your arm, hand, or fingers: ? Tingle. ? Become numb. ? Become swollen. ? Become painful. ? Turn white or blue. Summary  Shoulder pain can be caused by an injury, overuse, or arthritis.  Pay attention to changes in your symptoms. Let your health care provider know  about them.  This condition may be treated with a sling, ice, and pain medicines.  Contact your health care provider if the pain gets worse or new pain develops. Get help right away if your arm, hand, or fingers tingle or become numb, swollen, or painful.  Keep all follow-up visits as told by your health care provider. This is important. This information is not intended to replace advice given to you by your health care provider. Make sure you discuss any questions you have with your health care provider. Document Released: 03/01/2005 Document Revised: 12/04/2017 Document Reviewed: 12/04/2017 Elsevier Interactive Patient Education  2019 Elsevier Inc.   Hyperhidrosis Hyperhidrosis is a condition in which the body sweats a lot more than normal (excessively). Sweating is a necessary function for a human body. It is normal to sweat when you  are hot, physically active, or anxious. However, hyperhidrosis is sweating to an excessive degree. Although the condition is not a serious one, it can make you feel embarrassed. There are two kinds of hyperhidrosis:  Primary hyperhidrosis. The sweating usually localizes in one part of your body, such as your underarms, or in a few areas, such as your feet, face, underarms, and hands. This is the more common kind of hyperhidrosis.  Secondary hyperhidrosis. This type usually affects your entire body. What are the causes? The cause of this condition depends on the kind of hyperhidrosis that you have.  Primary hyperhidrosis may be caused by sweat glands that are more active than normal.  Secondary hyperhidrosis may be caused by an underlying condition or by taking certain medicines, such as antidepressants or diabetes medicines. Possible conditions that may cause secondary hyperhidrosis include: ? Diabetes. ? Gout. ? Anxiety. ? Obesity. ? Menopause. ? Overactive thyroid (hyperthyroidism). ? Tumors. ? Frostbite. ? Certain types of  cancers. ? Alcoholism. ? Injury to your nervous system. ? Stroke. ? Parkinson's disease. What increases the risk? You are more likely to develop primary hyperhidrosis if you have a family history of the condition. What are the signs or symptoms? Symptoms of this condition include:  Feeling like you are sweating constantly, even while you are not being active.  Having skin that peels or gets paler or softer in the areas where you sweat the most.  Being able to see sweat on your skin. Other symptoms depend on the kind of hyperhidrosis that you have.  Symptoms of primary hyperhidrosis may include: ? Sweating in the same location on both sides of your body. ? Sweating only during the day and not while you are sleeping. ? Sweating in specific areas, such as your underarms, palms, feet, and face.  Symptoms of secondary hyperhidrosis may include: ? Sweating all over your body. ? Sweating even while you sleep. How is this diagnosed? This condition may be diagnosed by:  Medical history.  Physical exam. You may also have other tests, including:  Tests to measure the amount of sweat you produce and to show the areas where you sweat the most. These tests may involve: ? Using color-changing chemicals to show patterns of sweating on the skin. ? Weighing paper that has been applied to the skin. This will show the amount of sweat that your body produces. ? Measuring the amount of water that evaporates from the skin. ? Using infrared technology to show patterns of sweating on the skin.  Tests to check for other conditions that may be causing excess sweating. This may include blood, urine, or imaging tests. How is this treated? Treatment for this condition depends on the kind of hyperhidrosis that you have and the areas of your body that are affected. Your health care provider will also treat any underlying conditions. Treatment may include:  Medicines, such as: ? Antiperspirants. These  are medicines that stop sweat. ? Injectable medicines. These may include small injections of botulinum toxin. ? Oral medicines. These are taken by mouth to treat underlying conditions and other symptoms.  A procedure to: ? Temporarily turn off the sweat glands in your hands and feet (iontophoresis). ? Remove your sweat glands. ? Cut or destroy the nerves so that they do not send a signal to the sweat glands (sympathectomy). Follow these instructions at home: Lifestyle   Limit or avoid foods or beverages that may increase your risk of sweating, such as: ? Spicy food. ? Caffeine. ?  Alcohol. ? Foods that contain monosodium glutamate (MSG).  If your feet sweat: ? Wear sandals when possible. ? Do not wear cotton socks. Wear socks that remove or wick moisture from your feet. ? Wear leather shoes. ? Avoid wearing the same pair of shoes for two days in a row.  Try placing sweat pads under your clothes to prevent underarm sweat from showing.  Keep a journal of your sweat symptoms and when they occur. This may help you identify things that trigger your sweating. General instructions  Take over-the-counter and prescription medicines only as told by your health care provider.  Use antiperspirants as told by your health care provider.  Consider joining a hyperhidrosis support group.  Keep all follow-up visits as told by your health care provider. This is important. Contact a health care provider if:  You have new symptoms.  Your symptoms get worse. Summary  Hyperhidrosis is a condition in which the body sweats a lot more than normal (excessively).  With primary hyperhidrosis, the sweating usually localizes in one part of your body, such as your underarms, or in a few areas, such as your feet, face, underarms, and hands. It is caused by overactive sweat glands in the affected area.  With secondary hyperhidrosis, the sweating affects your entire body. This is caused by an underlying  condition.  Treatment for this condition depends on the kind of hyperhidrosis that you have and the parts of your body that are affected. This information is not intended to replace advice given to you by your health care provider. Make sure you discuss any questions you have with your health care provider. Document Released: 07/21/2005 Document Revised: 05/25/2017 Document Reviewed: 05/25/2017 Elsevier Interactive Patient Education  2019 [employer]   Continue to drink plenty of water. Please use medications as needed- muscle relaxer for neck/shouler pain and Meloxicam for knee pain. Once you go back to school, please seek care at your school medical center and request referral to Physical therapy to address  Left shoulder/neck pain and Left knee pain. We will call you when thyroid level results are available. FEEL BETTER ! HAPPY HOLIDAYS!

## 2018-05-27 NOTE — Assessment & Plan Note (Signed)
Please use medications as needed- muscle relaxer for neck/shouler pain and Meloxicam for knee pain. Once you go back to school, please seek care at your school medical center and request referral to Physical therapy to address  Left shoulder/neck pain and Left knee pain.

## 2018-05-27 NOTE — Assessment & Plan Note (Signed)
Please use medications as needed- muscle relaxer for neck/shouler pain and Meloxicam for knee pain. Once you go back to school, please seek care at your school medical center and request referral to Physical therapy to address  Left shoulder/neck pain and Left knee pain.  

## 2018-05-27 NOTE — Progress Notes (Signed)
Subjective:    Patient ID: Annette Brown, female    DOB: 07/09/2000, 17 y.o.   MRN: [id number]  HPI:  Ms. Enis GashHuggins presents with several issues: 1) L knee pain r/t to fall in Sept.  She slipped on hill and fell onto L knee. All care she was received was in [place] she attends college-[facility] was seen at [facility] and referred to Orthopedic Specialist- MRI was neg She reports L knee pain is still present, 2/10 at rest, 8/10 with use, especially when walking long distances or up an inclines. She reports pain over L patella as sharp and L lateral knee as aching. She has been resting knee and using OTC NSAIDs, last dose > 10 days ago. She denies numbness/tingling in L foot She denies R knee pain 2) L shoulder pain and cervical neck stiffness- chronic in nature and has worsened with recent increase in upper body exercise. 3) Profuse axillary sweating She will return to school in [place] in 1 week, therefore referral to local PT is pointless  Patient Care Team    Relationship Specialty Notifications Start End  Julaine Fusianford, Elsey Holts D, NP PCP - General Family Medicine  01/22/17     Patient Active Problem List   Diagnosis Date Noted  . Enlarged tonsils 01/24/2018  . Pharyngitis, chronic 01/24/2018  . Atypical migraine 10/07/2017  . Chronic left shoulder pain 05/24/2017  . Screening for tuberculosis 01/22/2017  . Healthcare maintenance 01/22/2017     Past Medical History:  Diagnosis Date  . Bilateral ovarian cysts      History reviewed. No pertinent surgical history.   Family History  Problem Relation Age of Onset  . Hyperlipidemia Mother   . Hypertension Mother   . Ovarian cysts Mother   . Hyperlipidemia Paternal Uncle   . Hyperlipidemia Maternal Grandmother   . Stroke Maternal Grandmother   . Cancer Paternal Grandfather        brain and prostate  . Stroke Paternal Grandfather      Social History   Substance and Sexual Activity  Drug Use No     Social History   Substance and Sexual  Activity  Alcohol Use No     Social History   Tobacco Use  Smoking Status Never Smoker  Smokeless Tobacco Never Used     Outpatient Encounter Medications as of 05/27/2018  Medication Sig  . albuterol (PROVENTIL HFA;VENTOLIN HFA) 108 (90 Base) MCG/ACT inhaler Inhale 2 puffs into the lungs every 6 (six) hours as needed for wheezing or shortness of breath.  . beclomethasone (QVAR) 80 MCG/ACT inhaler Inhale 2 puffs into the lungs 2 (two) times daily.  . cyclobenzaprine (FLEXERIL) 10 MG tablet Take 1 tablet (10 mg total) by mouth 3 (three) times daily as needed for muscle spasms.  Marland Kitchen. HYDROcodone-acetaminophen (NORCO/VICODIN) 5-325 MG tablet Take 1 tablet by mouth every 6 (six) hours as needed.  . loratadine (CLARITIN) 10 MG tablet Take 10 mg by mouth daily as needed for allergies.  . mometasone (ELOCON) 0.1 % cream Apply 1 application topically daily as needed (rash).  . [DISCONTINUED] cyclobenzaprine (FLEXERIL) 10 MG tablet Take 10 mg by mouth 3 (three) times daily as needed for muscle spasms.  . meloxicam (MOBIC) 7.5 MG tablet Take 1 tablet (7.5 mg total) by mouth daily.   No facility-administered encounter medications on file as of 05/27/2018.     Allergies: Patient has no known allergies.  Body mass index is 22.43 kg/m.  Blood pressure 110/75, pulse (!) 113, temperature 98.4  F (36.9 C), temperature source Oral, height 5' 7.25" (1.708 m), weight 144 lb 4.8 oz (65.5 kg), SpO2 100 %.     Review of Systems  Constitutional: Positive for activity change and fatigue. Negative for appetite change, chills, diaphoresis, fever and unexpected weight change.  Eyes: Negative for visual disturbance.  Respiratory: Negative for cough, chest tightness, shortness of breath, wheezing and stridor.   Cardiovascular: Negative for chest pain, palpitations and leg swelling.  Musculoskeletal: Positive for arthralgias, gait problem, joint swelling, myalgias and neck stiffness. Negative for back pain.   Neurological: Negative for dizziness and headaches.  Hematological: Does not bruise/bleed easily.  Psychiatric/Behavioral: Negative for behavioral problems, confusion, decreased concentration, dysphoric mood, hallucinations, self-injury, sleep disturbance and suicidal ideas. The patient is not nervous/anxious and is not hyperactive.        Objective:   Physical Exam Vitals signs and nursing note reviewed.  Constitutional:      Appearance: Normal appearance. She is normal weight.  HENT:     Head: Normocephalic and atraumatic.  Cardiovascular:     Rate and Rhythm: Regular rhythm. Tachycardia present.     Pulses: Normal pulses.     Heart sounds: Normal heart sounds.  Pulmonary:     Effort: Pulmonary effort is normal. No respiratory distress.     Breath sounds: Normal breath sounds. No stridor. No wheezing, rhonchi or rales.  Chest:     Chest wall: No tenderness.  Musculoskeletal:        General: Swelling and tenderness present.     Right shoulder: Normal.     Left shoulder: She exhibits decreased range of motion and spasm.     Right knee: Normal.     Left knee: She exhibits normal range of motion, no swelling, normal alignment, no LCL laxity, normal patellar mobility, no bony tenderness, normal meniscus and no MCL laxity. Tenderness found. Medial joint line and patellar tendon tenderness noted.     Left ankle: Normal.     Cervical back: She exhibits pain and spasm.     Right lower leg: No edema.     Left lower leg: No edema.  Skin:    General: Skin is warm and dry.     Capillary Refill: Capillary refill takes less than 2 seconds.  Neurological:     Mental Status: She is alert and oriented to person, place, and time.  Psychiatric:        Mood and Affect: Mood normal.        Behavior: Behavior normal.        Thought Content: Thought content normal.        Judgment: Judgment normal.           Assessment & Plan:   1. Sweating abnormality   2. Acute pain of left knee    3. Chronic left shoulder pain   4. Healthcare maintenance     Sweating abnormality Elevated HR Profuse axillary sweating TSH drawn today   Healthcare maintenance Continue to drink plenty of water. Please use medications as needed- muscle relaxer for neck/shouler pain and Meloxicam for knee pain. Once you go back to school, please seek care at your school medical center and request referral to Physical therapy to address  Left shoulder/neck pain and Left knee pain. We will call you when CMP and thyroid level results are available.  Acute pain of left knee Please use medications as needed- muscle relaxer for neck/shouler pain and Meloxicam for knee pain. Once you go back to school,  please seek care at your school medical center and request referral to Physical therapy to address  Left shoulder/neck pain and Left knee pain.   Chronic left shoulder pain Please use medications as needed- muscle relaxer for neck/shouler pain and Meloxicam for knee pain. Once you go back to school, please seek care at your school medical center and request referral to Physical therapy to address  Left shoulder/neck pain and Left knee pain.    FOLLOW-UP:  No follow-ups on file.

## 2018-05-27 NOTE — Assessment & Plan Note (Signed)
Continue to drink plenty of water. Please use medications as needed- muscle relaxer for neck/shouler pain and Meloxicam for knee pain. Once you go back to school, please seek care at your school medical center and request referral to Physical therapy to address  Left shoulder/neck pain and Left knee pain. We will call you when CMP and thyroid level results are available.

## 2018-05-27 NOTE — Assessment & Plan Note (Signed)
Elevated HR Profuse axillary sweating TSH drawn today

## 2018-05-28 LAB — TSH: TSH: 1.5 u[IU]/mL (ref 0.450–4.500)

## 2018-05-31 LAB — COMPREHENSIVE METABOLIC PANEL
A/G RATIO: 2 (ref 1.2–2.2)
ALK PHOS: 68 IU/L (ref 39–117)
ALT: 9 IU/L (ref 0–32)
AST: 12 IU/L (ref 0–40)
Albumin: 4.2 g/dL (ref 3.5–5.5)
BUN/Creatinine Ratio: 13 (ref 9–23)
BUN: 10 mg/dL (ref 6–20)
Bilirubin Total: 0.5 mg/dL (ref 0.0–1.2)
CHLORIDE: 104 mmol/L (ref 96–106)
CO2: 20 mmol/L (ref 20–29)
Calcium: 9.3 mg/dL (ref 8.7–10.2)
Creatinine, Ser: 0.75 mg/dL (ref 0.57–1.00)
GFR calc Af Amer: 134 mL/min/{1.73_m2} (ref >59)
GFR calc non Af Amer: 116 mL/min/{1.73_m2} (ref >59)
GLOBULIN, TOTAL: 2.1 g/dL (ref 1.5–4.5)
Glucose: 88 mg/dL (ref 65–99)
POTASSIUM: 4.2 mmol/L (ref 3.5–5.2)
SODIUM: 138 mmol/L (ref 134–144)
Total Protein: 6.3 g/dL (ref 6.0–8.5)

## 2018-06-19 ENCOUNTER — Other Ambulatory Visit: Payer: Self-pay | Admitting: Adult Health

## 2018-07-09 ENCOUNTER — Telehealth: Payer: Self-pay | Admitting: Obstetrics & Gynecology

## 2018-07-09 NOTE — Telephone Encounter (Signed)
Spoke with patient. Patient reports increased right lower quadrant abdominal pelvic pain when exercising on the treadmill, started 2 wks ago. Resolves a few minutes after she stops exercising. Denies N/V, diarrhea, fever/chills or pain at this time. Denies any other GYN symptoms. IUD for contraceptive. Hx of ovarian cyst. Patient away at college in [place], declines OV.   Recommended patient f/u with student health center, PCP, ER or urgent care locally for further evaluation of symptoms. If OV needed with Dr. Hyacinth Meeker return call to office to schedule OV. Advised Dr. Hyacinth Meeker will review, I will return call if any additional recommendations. Patient verbalizes understanding.  Routing to provider for final review. Patient is agreeable to disposition. Will close encounter.

## 2018-07-09 NOTE — Telephone Encounter (Signed)
Patient is having right abdominal pain. 

## 2018-08-06 ENCOUNTER — Encounter: Payer: Self-pay | Admitting: Obstetrics & Gynecology

## 2018-08-06 ENCOUNTER — Other Ambulatory Visit: Payer: Self-pay

## 2018-08-06 ENCOUNTER — Ambulatory Visit (INDEPENDENT_AMBULATORY_CARE_PROVIDER_SITE_OTHER): Payer: BLUE CROSS/BLUE SHIELD | Admitting: Obstetrics & Gynecology

## 2018-08-06 VITALS — BP 110/60 | HR 88 | Resp 16 | Ht 67.5 in | Wt 144.0 lb

## 2018-08-06 DIAGNOSIS — Z01419 Encounter for gynecological examination (general) (routine) without abnormal findings: Secondary | ICD-10-CM

## 2018-08-06 DIAGNOSIS — Z23 Encounter for immunization: Secondary | ICD-10-CM

## 2018-08-06 DIAGNOSIS — N898 Other specified noninflammatory disorders of vagina: Secondary | ICD-10-CM | POA: Diagnosis not present

## 2018-08-06 NOTE — Addendum Note (Signed)
Addended by: Loreta Ave on: 08/06/2018 02:35 PM   Modules accepted: Orders

## 2018-08-06 NOTE — Patient Instructions (Signed)
Drysol 6.25% or 20%.  Apply nightly until sweating stops and then one to times weekly.

## 2018-08-06 NOTE — Progress Notes (Signed)
18 y.o. G0P0000 Single White or Caucasian female here for annual exam.  Doing well.  Sophomore in college.  Cycles are very light but she does have a lot cramping during two days.  She only needs to use a light tampon only.  She does use antiinflammatories and occasional a muscle relaxer.  Together with same partner.  GC/Chl testing done 5/19.  Declines today as she's had not other partners not has he.  They met in college.  Patient's last menstrual period was 07/29/2018 (exact date).          Sexually active: Yes.    The current method of family planning is IUD. Kyleena placed 10/18/17    Exercising: Yes.    walking, treadmill Smoker:  no  Health Maintenance: Pap:  never TDaP:  2011 Gardasil: had 1 (10/05/2017) Screening Labs: PCP   reports that she has never smoked. She has never used smokeless tobacco. She reports that she does not drink alcohol or use drugs.  Past Medical History:  Diagnosis Date  . Bilateral ovarian cysts   . Fall 2019   left knee     History reviewed. No pertinent surgical history.  Current Outpatient Medications  Medication Sig Dispense Refill  . albuterol (PROVENTIL HFA;VENTOLIN HFA) 108 (90 Base) MCG/ACT inhaler Inhale 2 puffs into the lungs every 6 (six) hours as needed for wheezing or shortness of breath. 1 Inhaler 2  . beclomethasone (QVAR) 80 MCG/ACT inhaler Inhale 2 puffs into the lungs 2 (two) times daily. 1 Inhaler 2  . cyclobenzaprine (FLEXERIL) 10 MG tablet Take 1 tablet (10 mg total) by mouth 3 (three) times daily as needed for muscle spasms. 30 tablet 1  . loratadine (CLARITIN) 10 MG tablet Take 10 mg by mouth daily as needed for allergies.    . meloxicam (MOBIC) 7.5 MG tablet Take 1 tablet (7.5 mg total) by mouth daily. 30 tablet 0  . mometasone (ELOCON) 0.1 % cream Apply 1 application topically daily as needed (rash).    . tacrolimus (PROTOPIC) 0.1 % ointment APP EXT AA BID     No current facility-administered medications for this visit.      Family History  Problem Relation Age of Onset  . Hyperlipidemia Mother   . Hypertension Mother   . Ovarian cysts Mother   . Hyperlipidemia Paternal Uncle   . Hyperlipidemia Maternal Grandmother   . Stroke Maternal Grandmother   . Cancer Paternal Grandfather        brain and prostate  . Stroke Paternal Grandfather     Review of Systems  Genitourinary: Menstrual problem: painful periods   All other systems reviewed and are negative.   Exam:   BP 110/60 (BP Location: Right Arm, Patient Position: Sitting, Cuff Size: Normal)   Pulse 88   Resp 16   Ht 5' 7.5" (1.715 m)   Wt 144 lb (65.3 kg)   LMP 07/29/2018 (Exact Date)   BMI 22.22 kg/m     Height: 5' 7.5" (171.5 cm)  Ht Readings from Last 3 Encounters:  08/06/18 5' 7.5" (1.715 m)  05/27/18 5' 7.25" (1.708 m) (88 %, Z= 1.16)*  01/24/18 5' 7.25" (1.708 m) (88 %, Z= 1.16)*   * Growth percentiles are based on CDC (Girls, 2-20 Years) data.    General appearance: alert, cooperative and appears stated age Head: Normocephalic, without obvious abnormality, atraumatic Neck: no adenopathy, supple, symmetrical, trachea midline and thyroid normal to inspection and palpation Lungs: clear to auscultation bilaterally Breasts: normal appearance, no  masses or tenderness Heart: regular rate and rhythm Abdomen: soft, non-tender; bowel sounds normal; no masses,  no organomegaly Extremities: extremities normal, atraumatic, no cyanosis or edema Skin: Skin color, texture, turgor normal. No rashes or lesions Lymph nodes: Cervical, supraclavicular, and axillary nodes normal. No abnormal inguinal nodes palpated Neurologic: Grossly normal   Pelvic: External genitalia:  no lesions              Urethra:  normal appearing urethra with no masses, tenderness or lesions              Bartholins and Skenes: normal                 Vagina: normal appearing vagina, watery and yellowish vaginal discharge, no lesions              Cervix: no lesions, IUD  string noted measuring               Pap taken: No. Bimanual Exam:  Uterus:  normal size, contour, position, consistency, mobility, non-tender              Adnexa: normal adnexa and no mass, fullness, tenderness               Rectovaginal: Confirms               Anus:  normal sphincter tone, no lesions  Chaperone was present for exam.  A:  Well Woman with normal exam H/O atypical migraines Dysmenorrhea Kyleena IUD placed 10/18/2017 Hyperhidrosis Watery and yellowish vaginal discharge  P:   Mammogram guidelines reviewed pap smear will be due after age 29.  D/w pt today Topical products for hyperhidrosis discussed Will receive second Gardisil vaccination today.  Aware to have third injection no sooner than 12/06/2018. Vaginitis testing obtained today. Return annually or prn

## 2018-08-13 LAB — NUSWAB VAGINITIS PLUS (VG+)
Candida albicans, NAA: NEGATIVE
Candida glabrata, NAA: NEGATIVE
Chlamydia trachomatis, NAA: NEGATIVE
Neisseria gonorrhoeae, NAA: NEGATIVE
Trich vag by NAA: NEGATIVE

## 2019-01-16 NOTE — Progress Notes (Deleted)
   Subjective:    Patient ID: Annette Brown, female    DOB: 26-Jul-2000, 18 y.o.   MRN: [id number]  HPI:  Ms.Arrey is here for CPE  Healthcare Maintenance: PAP-pap smear will be due after age 89.  D/w pt today Immunizations-  Patient Care Team    Relationship Specialty Notifications Start End  Irvin Lizama, Berna Spare, NP PCP - General Family Medicine  01/22/17     Patient Active Problem List   Diagnosis Date Noted  . Sweating abnormality 05/27/2018  . Enlarged tonsils 01/24/2018  . Pharyngitis, chronic 01/24/2018  . Atypical migraine 10/07/2017  . Chronic left shoulder pain 05/24/2017  . Healthcare maintenance 01/22/2017     Past Medical History:  Diagnosis Date  . Bilateral ovarian cysts   . Fall 2019   left knee      No past surgical history on file.   Family History  Problem Relation Age of Onset  . Hyperlipidemia Mother   . Hypertension Mother   . Ovarian cysts Mother   . Hyperlipidemia Paternal Uncle   . Hyperlipidemia Maternal Grandmother   . Stroke Maternal Grandmother   . Cancer Paternal Grandfather        brain and prostate  . Stroke Paternal Grandfather      Social History   Substance and Sexual Activity  Drug Use No     Social History   Substance and Sexual Activity  Alcohol Use No     Social History   Tobacco Use  Smoking Status Never Smoker  Smokeless Tobacco Never Used     Outpatient Encounter Medications as of 01/27/2019  Medication Sig  . albuterol (PROVENTIL HFA;VENTOLIN HFA) 108 (90 Base) MCG/ACT inhaler Inhale 2 puffs into the lungs every 6 (six) hours as needed for wheezing or shortness of breath.  . beclomethasone (QVAR) 80 MCG/ACT inhaler Inhale 2 puffs into the lungs 2 (two) times daily.  . cyclobenzaprine (FLEXERIL) 10 MG tablet Take 1 tablet (10 mg total) by mouth 3 (three) times daily as needed for muscle spasms.  Marland Kitchen loratadine (CLARITIN) 10 MG tablet Take 10 mg by mouth daily as needed for allergies.  . meloxicam (MOBIC)  7.5 MG tablet Take 1 tablet (7.5 mg total) by mouth daily.  . mometasone (ELOCON) 0.1 % cream Apply 1 application topically daily as needed (rash).  . tacrolimus (PROTOPIC) 0.1 % ointment APP EXT AA BID   No facility-administered encounter medications on file as of 01/27/2019.     Allergies: Patient has no known allergies.  There is no height or weight on file to calculate BMI.  There were no vitals taken for this visit.     Review of Systems     Objective:   Physical Exam        Assessment & Plan:  No diagnosis found.  No problem-specific Assessment & Plan notes found for this encounter.    FOLLOW-UP:  No follow-ups on file.

## 2019-01-27 ENCOUNTER — Encounter: Payer: BLUE CROSS/BLUE SHIELD | Admitting: Adult Health

## 2019-03-04 ENCOUNTER — Telehealth: Payer: Self-pay | Admitting: Obstetrics & Gynecology

## 2019-03-04 NOTE — Telephone Encounter (Signed)
Patient has a question about her appetite after going off her period.

## 2019-03-04 NOTE — Telephone Encounter (Signed)
Spoke with patient. Kyleena IUD placed 10/18/17. States she has always had a decreased appetite while on her menses, has experienced decreased appetite for 3 wks. LMP approximately 1.5 wks ago, menses are light, but regular. Reports fatigue, "not new for her, always tired". Diarrhea while on menses, "not new for her", has been intermittent since menses. Tolerating fluids. Denies N/V, fever/chills, pelvic pain, vaginal odor, discharge or urinary symptoms. Sexually active.   Recommended patient take UPT to r/o pregnancy, if negative and symptoms continue, will need further evaluation with PCP/Urgent Care. Advised Dr. Sabra Heck will review, our office will return call if any additional recommendations. Patient agreeable.   Dr. Sabra Heck  -please review.

## 2019-03-05 NOTE — Telephone Encounter (Signed)
Patient returned call. Patient states she took a pregnancy test and it was negative. RN advised patient to follow up with PCP. Patient agreeable.   Routing to provider and will close encounter.

## 2019-03-05 NOTE — Telephone Encounter (Signed)
If UPT is negative, should consider appt with a PCP.  As this is only the last three weeks, feel gyn issues are less likely the cause.

## 2019-03-05 NOTE — Telephone Encounter (Signed)
Message left to return call to Triage Nurse at [phone].    

## 2019-09-08 ENCOUNTER — Ambulatory Visit: Payer: PRIVATE HEALTH INSURANCE

## 2019-09-10 ENCOUNTER — Ambulatory Visit: Payer: PRIVATE HEALTH INSURANCE

## 2019-09-15 ENCOUNTER — Ambulatory Visit: Payer: PRIVATE HEALTH INSURANCE | Attending: Internal Medicine

## 2019-09-15 DIAGNOSIS — Z23 Encounter for immunization: Secondary | ICD-10-CM

## 2019-09-15 NOTE — Progress Notes (Signed)
   Covid-19 Vaccination Clinic  Name:  Annette Brown    MRN: [id number] DOB: 06-03-2001  09/15/2019  [name] was observed post Covid-19 immunization for 15 minutes without incident. She was provided with Vaccine Information Sheet and instruction to access the V-Safe system.   [name] was instructed to call 911 with any severe reactions post vaccine: Marland Kitchen Difficulty breathing  . Swelling of face and throat  . A fast heartbeat  . A bad rash all over body  . Dizziness and weakness   Immunizations Administered    Name Date Dose VIS Date Route   Pfizer COVID-19 Vaccine 09/15/2019 11:42 AM 0.3 mL 05/16/2019 Intramuscular   Manufacturer: [employer], [employer]   Lot: [id number]   NDC: [address]-2

## 2019-10-08 ENCOUNTER — Ambulatory Visit: Payer: PRIVATE HEALTH INSURANCE | Attending: Internal Medicine

## 2019-10-08 DIAGNOSIS — Z23 Encounter for immunization: Secondary | ICD-10-CM

## 2019-10-08 NOTE — Progress Notes (Signed)
   Covid-19 Vaccination Clinic  Name:  Annette Brown    MRN: [id number] DOB: November 28, 2000  10/08/2019  [name] was observed post Covid-19 immunization for 15 minutes without incident. She was provided with Vaccine Information Sheet and instruction to access the V-Safe system.   [name] was instructed to call 911 with any severe reactions post vaccine: Marland Kitchen Difficulty breathing  . Swelling of face and throat  . A fast heartbeat  . A bad rash all over body  . Dizziness and weakness   Immunizations Administered    Name Date Dose VIS Date Route   Pfizer COVID-19 Vaccine 10/08/2019 12:42 PM 0.3 mL 07/30/2018 Intramuscular   Manufacturer: [employer], [employer]   Lot: [id number]   NDC: [address]-8

## 2019-12-10 ENCOUNTER — Telehealth: Payer: Self-pay

## 2019-12-10 NOTE — Telephone Encounter (Signed)
AEX 08/06/2018 H/o right ovarian cyst IUD placed 2019  Spoke with pt. Pt reports having heavy cycle in June. Pt states LMP 6/15 and lasted for 7 days. Pt states bleeding through menstrual cup every 2 hours and then changed to pad/tampon and changed every 2 hours for 4 days. Pt reports feeling weak when it happened and missed work for 2 days.  Denies passing out, abd pain or cramping. Was not seen in ER or Urgent care.  Pt denies blood clots, vaginal discharge or itching. Pt then reports cycle tapering off and changed tampon/pad every 4-6 hours.  Pt states had foul odor when using menstrual cup but resolved once menses over.  Pt reports bleeding heavier with last 2 cycles since having IUD.   Pt states then started next cycle 7/6 and bleeding not as heavy, changing a pad/tampon every 4-6 hours with no clots or soaking a pad. Pt states still having slight odor. Pt denies having any exposure to STDs. Had 2-6 partners 5-6 months ago, but has 1 partner for the last 4 months. Does not use condoms, pt has IUD in place. Pt denies changing soaps or detergents. Denies vaginal discharge or itching, abd pain or cramping at this time as well.  Pt states is going out of town Sunday on vacation and lives in [place] for school and will not in area. Pt advised to be seen at closest urgent care for further evaluation. Pt agreeable and verbalized understanding.  Advised will review with Dr Hyacinth Meeker. Pt agreeable.   Routing to Dr Hyacinth Meeker for review.  Encounter closed.

## 2019-12-10 NOTE — Telephone Encounter (Signed)
Patient is calling in regards to irregular bleeding, stating it is heavy and has odor.

## 2019-12-22 NOTE — Progress Notes (Signed)
19 y.o. G0P0000 Single White or Caucasian female here for annual exam.  School was hard for her due to the remote learning.  She has two or three semesters depending on schedule load.    Has Kyleena IUD placed 2019.  Cycles have been short and light until her June cycle.  Flow was four days but quite heavy and then July's cycle was similar but lighter and started early.    She is SA.    Was in the ER in July in [place], [place].  Provider thought she had a ruptured cyst but did not have any imaging.  Had neg pregnancy test in ER.  Is using a Diva cup.  D/w pt increased risk of displaced IUD with Diva cup use.  Instructed pt on releasing seal prior to removing Diva cup.  Patient's last menstrual period was 12/09/2019 (exact date).          Sexually active: Yes.    The current method of family planning is IUD.   Kyleena inserted 2019 Exercising: Yes.    walking Smoker:  no  Health Maintenance: Pap:  none History of abnormal Pap:  no MMG:  none Colonoscopy:  None BMD:   none TDaP:  UTD for school Pneumonia vaccine(s):  no Shingrix:   no Hep C testing: not done Screening Labs: STD screening obtained today   reports that she has never smoked. She has never used smokeless tobacco. She reports that she does not drink alcohol and does not use drugs.  Past Medical History:  Diagnosis Date  . Bilateral ovarian cysts   . Fall 2019   left knee   . Ruptured cyst of ovary     History reviewed. No pertinent surgical history.  Current Outpatient Medications  Medication Sig Dispense Refill  . albuterol (PROVENTIL HFA;VENTOLIN HFA) 108 (90 Base) MCG/ACT inhaler Inhale 2 puffs into the lungs every 6 (six) hours as needed for wheezing or shortness of breath. 1 Inhaler 2  . beclomethasone (QVAR) 80 MCG/ACT inhaler Inhale 2 puffs into the lungs 2 (two) times daily. 1 Inhaler 2  . cyclobenzaprine (FLEXERIL) 10 MG tablet Take 1 tablet (10 mg total) by mouth 3 (three) times daily as needed for  muscle spasms. 30 tablet 1  . loratadine (CLARITIN) 10 MG tablet Take 10 mg by mouth daily as needed for allergies.    . mometasone (ELOCON) 0.1 % cream Apply 1 application topically daily as needed (rash).    . tacrolimus (PROTOPIC) 0.1 % ointment APP EXT AA BID     No current facility-administered medications for this visit.    Family History  Problem Relation Age of Onset  . Hyperlipidemia Mother   . Hypertension Mother   . Ovarian cysts Mother   . Hyperlipidemia Paternal Uncle   . Hyperlipidemia Maternal Grandmother   . Stroke Maternal Grandmother   . Cancer Paternal Grandfather        brain and prostate  . Stroke Paternal Grandfather     Review of Systems  Constitutional: Negative.   HENT: Negative.   Eyes: Negative.   Respiratory: Negative.   Cardiovascular: Negative.   Gastrointestinal: Negative.   Endocrine: Negative.   Genitourinary:       Irregular cycle with IUD with heavy bleeding  Musculoskeletal: Negative.   Skin: Negative.   Allergic/Immunologic: Negative.   Neurological: Negative.   Hematological: Negative.   Psychiatric/Behavioral: Negative.     Exam:   BP 120/80   Pulse 70   Resp 16  Ht 5' 7.25" (1.708 m)   Wt 140 lb (63.5 kg)   LMP 12/09/2019 (Exact Date)   BMI 21.76 kg/m   Height: 5' 7.25" (170.8 cm)  General appearance: alert, cooperative and appears stated age Head: Normocephalic, without obvious abnormality, atraumatic Neck: no adenopathy, supple, symmetrical, trachea midline and thyroid normal to inspection and palpation Lungs: clear to auscultation bilaterally Breasts: normal appearance, no masses or tenderness Heart: regular rate and rhythm Abdomen: soft, non-tender; bowel sounds normal; no masses,  no organomegaly Extremities: extremities normal, atraumatic, no cyanosis or edema Skin: Skin color, texture, turgor normal. No rashes or lesions Lymph nodes: Cervical, supraclavicular, and axillary nodes normal. No abnormal inguinal  nodes palpated Neurologic: Grossly normal   Pelvic: External genitalia:  no lesions              Urethra:  normal appearing urethra with no masses, tenderness or lesions              Bartholins and Skenes: normal                 Vagina: normal appearing vagina with normal color and discharge, no lesions              Cervix: no lesions, IUD string noted              Pap taken: Yes.   Bimanual Exam:  Uterus:  normal size, contour, position, consistency, mobility, non-tender              Adnexa: normal adnexa and no mass, fullness, tenderness               Rectovaginal: Confirms               Anus:  normal sphincter tone, no lesions  Chaperone, Zenovia Jordan, CMA, was present for exam.  A:  Well Woman with normal exam H/o atypical migraines Dysmenorrhea improved with IUD Irregular bleeding these past two months Kyleena IUD placed 10/18/2017 H/o hyperhidrosis Diva cup use  P:   Mammogram guidelines reviewed pap smear obtained today.  GC/Chl/trich also obtained with pap smear If this is negative, we discussed starting micronor x 3 months to see if this will helps cycles Return annually or prn

## 2019-12-23 ENCOUNTER — Ambulatory Visit: Payer: BC Managed Care – PPO | Admitting: Obstetrics & Gynecology

## 2019-12-23 ENCOUNTER — Encounter: Payer: Self-pay | Admitting: Obstetrics & Gynecology

## 2019-12-23 ENCOUNTER — Other Ambulatory Visit: Payer: Self-pay

## 2019-12-23 ENCOUNTER — Other Ambulatory Visit (HOSPITAL_COMMUNITY)
Admission: RE | Admit: 2019-12-23 | Discharge: 2019-12-23 | Disposition: A | Discharge status: Home / Self Care | Payer: BC Managed Care – PPO | Source: Ambulatory Visit | Attending: Obstetrics & Gynecology | Admitting: Obstetrics & Gynecology

## 2019-12-23 VITALS — BP 120/80 | HR 70 | Resp 16 | Ht 67.25 in | Wt 140.0 lb

## 2019-12-23 DIAGNOSIS — Z01419 Encounter for gynecological examination (general) (routine) without abnormal findings: Secondary | ICD-10-CM | POA: Insufficient documentation

## 2019-12-23 DIAGNOSIS — Z113 Encounter for screening for infections with a predominantly sexual mode of transmission: Secondary | ICD-10-CM | POA: Diagnosis not present

## 2019-12-23 DIAGNOSIS — Z124 Encounter for screening for malignant neoplasm of cervix: Secondary | ICD-10-CM | POA: Diagnosis not present

## 2019-12-24 ENCOUNTER — Encounter: Payer: Self-pay | Admitting: Obstetrics & Gynecology

## 2019-12-24 LAB — CYTOLOGY - PAP
Chlamydia: POSITIVE — AB
Comment: NEGATIVE
Comment: NEGATIVE
Comment: NORMAL
Diagnosis: NEGATIVE
Neisseria Gonorrhea: NEGATIVE
Trichomonas: NEGATIVE

## 2019-12-25 ENCOUNTER — Other Ambulatory Visit: Payer: Self-pay | Admitting: *Deleted

## 2019-12-25 MED ORDER — AZITHROMYCIN 250 MG PO TABS: 1000.0000 mg | ORAL_TABLET | Freq: Once | ORAL | 0 refills | Status: AC

## 2020-01-23 ENCOUNTER — Telehealth: Payer: Self-pay

## 2020-01-23 NOTE — Telephone Encounter (Signed)
Left message to call Noreene Larsson, RN at [facility] [phone].   Last AEX 12/23/19

## 2020-01-23 NOTE — Telephone Encounter (Signed)
Patient is calling in regards to having yeast infection. Patient would like advise on over the counter medicines.

## 2020-01-26 NOTE — Telephone Encounter (Signed)
Spoke with patient. Patient is on oral abx prescribed by another provider for skin abscess, has 1.5 days left of medication. Patient reports vaginal itching, redness dn white vaginal d/c that started on 01/22/20. Denies vaginal odor or pelvic pain. Patient declines an OV at this time. Asking what she can try OTC first?   Advised she can try OTC Monistat 1, 3 or 7 day. If symptoms do not resolve or new symptoms develop, OV recommended for further evaluation. Patient verbalizes understanding and is agreeable.   Routing to provider for final review. Patient is agreeable to disposition. Will close encounter.

## 2020-02-03 ENCOUNTER — Ambulatory Visit: Payer: Self-pay | Admitting: Obstetrics & Gynecology

## 2020-02-13 NOTE — Progress Notes (Signed)
GYNECOLOGY  VISIT  CC:   Recheck for chlamydia   HPI: 19 y.o. G0P0000 Single White or Caucasian female here for 1 mth recheck of chlamydia.  This cycle was a little different--she spotted for 4 days before her cycle started.  She then had a one day cycle and then just some spotting.  Bleeding is much improved compared to cycles earlier in the summer.  Also, pt was having significant odor with her menstrual cycle and this has resolved as well.  Is together with current partner for 4 months.  He was tested and treated as well.  Pt is now aware this came from partner prior to current one.  That relationship ended months ago so pt thinks she had this for a while.  Reviewed with pt treatment if still positive considering presence of IUD.  Denies fever or pelvic pain.  Pt had hx of abscess on skin at hip within the last month.  Was on antibiotics for 10 days.  Area is healing.  Felt she had yeast from antibiotics.  Used OTC treatment.  Symptoms are improved but not fully resolved.    GYNECOLOGIC HISTORY: No LMP recorded. (Menstrual status: IUD). Contraception: iud Menopausal hormone therapy: none  Patient Active Problem List   Diagnosis Date Noted  . Sweating abnormality 05/27/2018  . Enlarged tonsils 01/24/2018  . Pharyngitis, chronic 01/24/2018  . Atypical migraine 10/07/2017  . Chronic left shoulder pain 05/24/2017  . Healthcare maintenance 01/22/2017    Past Medical History:  Diagnosis Date  . Bilateral ovarian cysts   . Fall 2019   left knee   . Ruptured cyst of ovary     History reviewed. No pertinent surgical history.  MEDS:   Current Outpatient Medications on File Prior to Visit  Medication Sig Dispense Refill  . albuterol (PROVENTIL HFA;VENTOLIN HFA) 108 (90 Base) MCG/ACT inhaler Inhale 2 puffs into the lungs every 6 (six) hours as needed for wheezing or shortness of breath. 1 Inhaler 2  . beclomethasone (QVAR) 80 MCG/ACT inhaler Inhale 2 puffs into the lungs 2 (two) times  daily. 1 Inhaler 2  . cyclobenzaprine (FLEXERIL) 10 MG tablet Take 1 tablet (10 mg total) by mouth 3 (three) times daily as needed for muscle spasms. 30 tablet 1  . levonorgestrel (KYLEENA) 19.5 MG IUD by Intrauterine route once.    . loratadine (CLARITIN) 10 MG tablet Take 10 mg by mouth daily as needed for allergies.    . mometasone (ELOCON) 0.1 % cream Apply 1 application topically daily as needed (rash).    . tacrolimus (PROTOPIC) 0.1 % ointment APP EXT AA BID     No current facility-administered medications on file prior to visit.    ALLERGIES: Patient has no known allergies.  Family History  Problem Relation Age of Onset  . Hyperlipidemia Mother   . Hypertension Mother   . Ovarian cysts Mother   . Hyperlipidemia Paternal Uncle   . Hyperlipidemia Maternal Grandmother   . Stroke Maternal Grandmother   . Cancer Paternal Grandfather        brain and prostate  . Stroke Paternal Grandfather     SH:  Single, non smoker  Review of Systems  Constitutional: Negative.   HENT: Negative.   Eyes: Negative.   Respiratory: Negative.   Cardiovascular: Negative.   Gastrointestinal: Negative.   Endocrine: Negative.   Genitourinary: Negative.   Musculoskeletal: Negative.   Skin: Negative.   Allergic/Immunologic: Negative.   Neurological: Negative.   Hematological: Negative.   Psychiatric/Behavioral:  Negative.     PHYSICAL EXAMINATION:    BP 104/60   Pulse 68   Resp 16   Wt 140 lb (63.5 kg)   BMI 21.76 kg/m     General appearance: alert, cooperative and appears stated age Abdomen: soft, non-tender; bowel sounds normal; no masses,  no organomegaly Lymph:  no inguinal LAD noted  Pelvic: External genitalia:  no lesions              Urethra:  normal appearing urethra with no masses, tenderness or lesions              Bartholins and Skenes: normal                 Vagina: normal appearing vagina with normal color and discharge, no lesions              Cervix: no lesions and IUD  string noted              Bimanual Exam:  Uterus:  normal size, contour, position, consistency, mobility, non-tender              Adnexa: no mass, fullness, tenderness  Chaperone, Zenovia Jordan, CMA, was present for exam.  Assessment: H/o chlamydia Kyleena IUD placed 10/2017 H/o menorrhagia with cycles earlier this summer that has now improved Recent skin abscess with antibiotic use, then subsequent yeast vaginitis  Plan: Repeat GC/Chl testing obtained today as well as vaginitis testing.     22 minutes spent with pt

## 2020-02-16 ENCOUNTER — Encounter: Payer: Self-pay | Admitting: Obstetrics & Gynecology

## 2020-02-16 ENCOUNTER — Ambulatory Visit: Payer: BC Managed Care – PPO | Admitting: Obstetrics & Gynecology

## 2020-02-16 ENCOUNTER — Other Ambulatory Visit: Payer: Self-pay

## 2020-02-16 VITALS — BP 104/60 | HR 68 | Resp 16 | Wt 140.0 lb

## 2020-02-16 DIAGNOSIS — Z8619 Personal history of other infectious and parasitic diseases: Secondary | ICD-10-CM | POA: Insufficient documentation

## 2020-02-16 DIAGNOSIS — N898 Other specified noninflammatory disorders of vagina: Secondary | ICD-10-CM | POA: Diagnosis not present

## 2020-02-16 DIAGNOSIS — Z975 Presence of (intrauterine) contraceptive device: Secondary | ICD-10-CM

## 2020-02-18 LAB — NUSWAB VAGINITIS PLUS (VG+)
Candida albicans, NAA: NEGATIVE
Candida glabrata, NAA: NEGATIVE
Chlamydia trachomatis, NAA: NEGATIVE
Neisseria gonorrhoeae, NAA: NEGATIVE
Trich vag by NAA: NEGATIVE

## 2020-03-29 NOTE — Progress Notes (Signed)
GYNECOLOGY  VISIT  CC:   Repeat STD testing  HPI: 19 y.o. G0P0000 Single White or Caucasian female here for std testing.  H/o chlamydia 12/23/19.  had IUD placed 5/19.  Prior to chlamydia diagnosis, cycles had gotten heavier and more irregular.  She reports menstrual cycles are now more normal.  Last cycle was about 7 all total, three of heavier bleeding and 4 of spotting.  Denies midcycle bleeding or pain.  Denies vaginal discharge or odor.  Has moved in with boyfriend and is helping a lot with care of his daughter.  They are in [place] so considering transfer of care to more local provider.  I do not know anyone in the are where she lives.  Had recent issue with bilateral nipple itching.  There does seem to be some skin changes as well.  Denies any nipple discharge or bleeding.  No masses.  Looked on the internet and found cancer as a possible cause.  GYNECOLOGIC HISTORY: No LMP recorded. (Menstrual status: IUD). Contraception:kyleena iud Menopausal hormone therapy: none  Patient Active Problem List   Diagnosis Date Noted  . History of chlamydia 02/16/2020  . IUD (intrauterine device) in place 02/16/2020  . Sweating abnormality 05/27/2018  . Enlarged tonsils 01/24/2018  . Pharyngitis, chronic 01/24/2018  . Atypical migraine 10/07/2017  . Chronic left shoulder pain 05/24/2017  . Healthcare maintenance 01/22/2017    Past Medical History:  Diagnosis Date  . Bilateral ovarian cysts   . Fall 2019   left knee   . Ruptured cyst of ovary     History reviewed. No pertinent surgical history.  MEDS:   Current Outpatient Medications on File Prior to Visit  Medication Sig Dispense Refill  . albuterol (PROVENTIL HFA;VENTOLIN HFA) 108 (90 Base) MCG/ACT inhaler Inhale 2 puffs into the lungs every 6 (six) hours as needed for wheezing or shortness of breath. 1 Inhaler 2  . beclomethasone (QVAR) 80 MCG/ACT inhaler Inhale 2 puffs into the lungs 2 (two) times daily. 1 Inhaler 2  .  cyclobenzaprine (FLEXERIL) 10 MG tablet Take 1 tablet (10 mg total) by mouth 3 (three) times daily as needed for muscle spasms. 30 tablet 1  . levonorgestrel (KYLEENA) 19.5 MG IUD by Intrauterine route once.    . loratadine (CLARITIN) 10 MG tablet Take 10 mg by mouth daily as needed for allergies.    . mometasone (ELOCON) 0.1 % cream Apply 1 application topically daily as needed (rash).    . tacrolimus (PROTOPIC) 0.1 % ointment APP EXT AA BID     No current facility-administered medications on file prior to visit.    ALLERGIES: Patient has no known allergies.  Family History  Problem Relation Age of Onset  . Hyperlipidemia Mother   . Hypertension Mother   . Ovarian cysts Mother   . Hyperlipidemia Paternal Uncle   . Hyperlipidemia Maternal Grandmother   . Stroke Maternal Grandmother   . Cancer Paternal Grandfather        brain and prostate  . Stroke Paternal Grandfather     SH:  Single, non smoker  Review of Systems  Constitutional: Negative.   HENT: Negative.   Eyes: Negative.   Respiratory: Negative.   Cardiovascular: Negative.   Gastrointestinal: Negative.   Endocrine: Negative.   Genitourinary: Negative.   Musculoskeletal: Negative.   Skin: Negative.   Allergic/Immunologic: Negative.   Neurological: Negative.   Hematological: Negative.   Psychiatric/Behavioral: Negative.     PHYSICAL EXAMINATION:    BP 110/64  Pulse 68   Resp 16   Wt 143 lb (64.9 kg)   BMI 22.23 kg/m     General appearance: alert, cooperative and appears stated age Breasts:  No masses, no LAD, nipples bilaterally are erythematous with some scale around outer edge of nipples Abdomen: soft, non-tender; bowel sounds normal; no masses,  no organomegaly Lymph:  no inguinal LAD noted  Pelvic: External genitalia:  no lesions              Urethra:  normal appearing urethra with no masses, tenderness or lesions              Bartholins and Skenes: normal                 Vagina: normal appearing  vagina with normal color and discharge, no lesions              Cervix: no lesions and IUD string noted              Bimanual Exam:  Uterus:  normal size, contour, position, consistency, mobility, non-tender              Adnexa: no mass, fullness, tenderness  Chaperone, Cornelia Copa, CMA, was present for exam.  Assessment: H/o chlamydia 12/2018 Kyleena IUD placed 2019 Possible candida on nipples with itching  Plan: GC/Chl obtained today.   Topical nystatin cream bid for up to 7 days to pharmacy.  If does not resolve, advised to call back. Does need HIV, RPR, Hep B and C obtained as well.

## 2020-03-30 ENCOUNTER — Encounter: Payer: Self-pay | Admitting: Obstetrics & Gynecology

## 2020-03-30 ENCOUNTER — Ambulatory Visit: Payer: BC Managed Care – PPO | Admitting: Obstetrics & Gynecology

## 2020-03-30 ENCOUNTER — Other Ambulatory Visit: Payer: Self-pay

## 2020-03-30 VITALS — BP 110/64 | HR 68 | Resp 16 | Wt 143.0 lb

## 2020-03-30 DIAGNOSIS — Z8619 Personal history of other infectious and parasitic diseases: Secondary | ICD-10-CM

## 2020-03-30 DIAGNOSIS — L309 Dermatitis, unspecified: Secondary | ICD-10-CM

## 2020-03-30 MED ORDER — NYSTATIN 100000 UNIT/GM EX CREA: 1.0000 "application " | TOPICAL_CREAM | Freq: Two times a day (BID) | CUTANEOUS | 0 refills | Status: AC

## 2020-03-31 LAB — GC/CHLAMYDIA PROBE AMP
Chlamydia trachomatis, NAA: NEGATIVE
Neisseria Gonorrhoeae by PCR: NEGATIVE

## 2020-04-06 ENCOUNTER — Telehealth: Payer: Self-pay

## 2020-04-06 NOTE — Telephone Encounter (Signed)
Spoke with pt. Pt calling to give update since OV on 03/30/20 about bilateral breast nipple discharge. Pt states did the Nystatin Rx BID for 7 days and now around base of both nipples is yellow, pus-like discharge, unsure of odor, denies redness or warmth. Denies fever or chills. Pt states nipples are cracked and painful when rubbed. Pt states using non stick Band-Aids to help with rubbing while wearing shirts/bra. States notices discharge in band-aid after removal. Pt asking if needs to be seen for OV or at Urgent care near her since in school in [place].   Advised will give update to Dr Hyacinth Meeker and return call. Pt agreeable. Pt states can leave detailed message on mobile number due to pt in class this afternoon.   Routing to Dr Hyacinth Meeker. Please advise

## 2020-04-06 NOTE — Telephone Encounter (Signed)
Attempted to call pt with update and recommendations. Pt's phone busy, no other number on file. Will try to give pt call back on 11/3 am.

## 2020-04-06 NOTE — Telephone Encounter (Signed)
I think it might be good to be seen at a local urgent care due to the change that has occurred.  Thanks.

## 2020-04-06 NOTE — Telephone Encounter (Signed)
Patient is calling regarding "hard and itchy nipples". Patient stated she has taking her prescribed RX and her nipples are now "oozing yellow pus and hurt".

## 2020-04-07 NOTE — Telephone Encounter (Signed)
Left detailed message with Dr Hyacinth Meeker recommendations. Pt to return call with any questions or concerns at this time. Encounter closed

## 2020-11-15 ENCOUNTER — Other Ambulatory Visit: Payer: Self-pay

## 2020-11-15 ENCOUNTER — Encounter: Payer: Self-pay | Admitting: *Deleted

## 2020-11-15 ENCOUNTER — Telehealth: Payer: Self-pay

## 2020-11-15 ENCOUNTER — Ambulatory Visit
Admission: EM | Admit: 2020-11-15 | Discharge: 2020-11-15 | Disposition: A | Discharge status: Home / Self Care | Payer: BC Managed Care – PPO | Attending: Student | Admitting: Student

## 2020-11-15 DIAGNOSIS — Z975 Presence of (intrauterine) contraceptive device: Secondary | ICD-10-CM | POA: Diagnosis not present

## 2020-11-15 DIAGNOSIS — L02412 Cutaneous abscess of left axilla: Secondary | ICD-10-CM | POA: Insufficient documentation

## 2020-11-15 HISTORY — DX: Cutaneous abscess, unspecified: L02.91

## 2020-11-15 HISTORY — DX: Unspecified asthma, uncomplicated: J45.909

## 2020-11-15 MED ORDER — DOXYCYCLINE HYCLATE 100 MG PO CAPS: 100.0000 mg | ORAL_CAPSULE | Freq: Two times a day (BID) | ORAL | 0 refills | Status: AC

## 2020-11-15 NOTE — Discharge Instructions (Addendum)
-  Doxycycline twice daily for 7 days.  Make sure to wear sunscreen while taking this medication as it can increase your chance of sunburn. -Wash the wound with gentle soap and water twice daily.  You can follow this with a pressure dressing using nonstick gauze and tape. -A warm compress can help remaining pus come out -Seek additional medical attention if you develop new symptoms like fevers and chills, worsening of abscess despite treatment

## 2020-11-15 NOTE — ED Notes (Signed)
Following left axillary I&D, pt c/o "feeling clammy".  Pt layed supine, with cool cloths placed, with relief.  PO food offered, but pt declined stating she was feeling better.

## 2020-11-15 NOTE — ED Provider Notes (Addendum)
[facility]    CSN: [id number] Arrival date & time: 11/15/20  0816      History   Chief Complaint Chief Complaint  Patient presents with   Abscess    HPI Annette Brown is a 20 y.o. female presenting with abscess L axilla, getting progressively worse. Medical history of multiple abscesses in the past. Denies fevers/chills. States in the past, her abscesses have typically started spontaneously draining on their own, but not this time.   HPI  Past Medical History:  Diagnosis Date   Abscess    Asthma    Bilateral ovarian cysts    Fall 2019   left knee    Ruptured cyst of ovary     Patient Active Problem List   Diagnosis Date Noted   History of chlamydia 02/16/2020   IUD (intrauterine device) in place 02/16/2020   Sweating abnormality 05/27/2018   Enlarged tonsils 01/24/2018   Pharyngitis, chronic 01/24/2018   Atypical migraine 10/07/2017   Chronic left shoulder pain 05/24/2017   Healthcare maintenance 01/22/2017    History reviewed. No pertinent surgical history.  OB History     Gravida  0   Para  0   Term  0   Preterm  0   AB  0   Living  0      SAB  0   IAB  0   Ectopic  0   Multiple  0   Live Births  0            Home Medications    Prior to Admission medications   Medication Sig Start Date End Date Taking? Authorizing Provider  doxycycline (VIBRAMYCIN) 100 MG capsule Take 1 capsule (100 mg total) by mouth 2 (two) times daily for 7 days. 11/15/20 11/22/20 Yes Rhys Martini, PA-C  levonorgestrel [facility]) 19.5 MG IUD by Intrauterine route once.   Yes [provider]  loratadine (CLARITIN) 10 MG tablet Take 10 mg by mouth daily as needed for allergies.   Yes [provider]  mometasone (ELOCON) 0.1 % cream Apply 1 application topically daily as needed (rash).   Yes [provider]  tacrolimus (PROTOPIC) 0.1 % ointment APP EXT AA BID 07/31/18  Yes [provider]  albuterol (PROVENTIL  HFA;VENTOLIN HFA) 108 (90 Base) MCG/ACT inhaler Inhale 2 puffs into the lungs every 6 (six) hours as needed for wheezing or shortness of breath. 01/24/18   Danford, Orpha Bur D, NP  beclomethasone (QVAR) 80 MCG/ACT inhaler Inhale 2 puffs into the lungs 2 (two) times daily. 01/24/18   Danford, Orpha Bur D, NP  cyclobenzaprine (FLEXERIL) 10 MG tablet Take 1 tablet (10 mg total) by mouth 3 (three) times daily as needed for muscle spasms. 05/27/18   Danford, Orpha Bur D, NP  nystatin cream (MYCOSTATIN) Apply 1 application topically 2 (two) times daily. Apply to affected area BID for up to 7 days. 03/30/20   Jerene Bears, MD    Family History Family History  Problem Relation Age of Onset   Hyperlipidemia Mother    Hypertension Mother    Ovarian cysts Mother    Hyperlipidemia Paternal Uncle    Hyperlipidemia Maternal Grandmother    Stroke Maternal Grandmother    Cancer Paternal Grandfather        brain and prostate   Stroke Paternal Grandfather     Social History Social History   Tobacco Use   Smoking status: Never   Smokeless tobacco: Never  Vaping Use   Vaping Use: Never  used  Substance Use Topics   Alcohol use: Not Currently    Comment: occasionally   Drug use: No     Allergies   Patient has no known allergies.   Review of Systems Review of Systems  Skin:        abscess  All other systems reviewed and are negative.   Physical Exam Triage Vital Signs ED Triage Vitals  Enc Vitals Group     BP      Pulse      Resp      Temp      Temp src      SpO2      Weight      Height      Head Circumference      Peak Flow      Pain Score      Pain Loc      Pain Edu?      Excl. in GC?    No data found.  Updated Vital Signs BP 127/85   Pulse (!) 101   Temp (!) 97.5 F (36.4 C) (Temporal)   LMP 11/09/2020 (Exact Date)   SpO2 99%   Visual Acuity Right Eye Distance:   Left Eye Distance:   Bilateral Distance:    Right Eye Near:   Left Eye Near:    Bilateral Near:      Physical Exam Vitals reviewed.  Constitutional:      General: She is not in acute distress.    Appearance: Normal appearance. She is not ill-appearing or diaphoretic.  HENT:     Head: Normocephalic and atraumatic.  Cardiovascular:     Rate and Rhythm: Normal rate and regular rhythm.     Heart sounds: Normal heart sounds.  Pulmonary:     Effort: Pulmonary effort is normal.     Breath sounds: Normal breath sounds.  Skin:    General: Skin is warm.     Comments: L axilla with 2x2cm abscess, fluctuant. No surrounding erythema or warmth.  Neurological:     General: No focal deficit present.     Mental Status: She is alert and oriented to person, place, and time.  Psychiatric:        Mood and Affect: Mood normal.        Behavior: Behavior normal.        Thought Content: Thought content normal.        Judgment: Judgment normal.     UC Treatments / Results  Labs (all labs ordered are listed, but only abnormal results are displayed) Labs Reviewed - No data to display  EKG   Radiology No results found.  Procedures Incision and Drainage  Date/Time: 11/15/2020 9:00 AM Performed by: Rhys Martini, PA-C Authorized by: Rhys Martini, PA-C   Consent:    Consent obtained:  Verbal   Consent given by:  Patient   Risks, benefits, and alternatives were discussed: yes     Risks discussed:  Bleeding, incomplete drainage, pain and infection   Alternatives discussed:  No treatment Universal protocol:    Procedure explained and questions answered to patient or proxy's satisfaction: yes     Patient identity confirmed:  Verbally with patient Location:    Type:  Abscess   Size:  2x2.5cm   Location:  Upper extremity   Upper extremity location: L axilla. Anesthesia:    Anesthesia method:  Local infiltration Procedure details:    Incision types:  Stab incision   Wound management:  Probed and  deloculated   Drainage:  Purulent   Drainage amount:  Moderate   Wound treatment:   Wound left open Post-procedure details:    Procedure completion:  Tolerated Comments:     Following procedure, few minutes of lightheadedness and nausea. Resolved with lying flat, water and crackers.  (including critical care time)  Medications Ordered in UC Medications - No data to display  Initial Impression / Assessment and Plan / UC Course  I have reviewed the triage vital signs and the nursing notes.  Pertinent labs & imaging results that were available during my care of the patient were reviewed by me and considered in my medical decision making (see chart for details).     This patient is a 20 year old female patient presenting with abscess L axilla. I&D performed as above. Doxycycline sent. Wound care instructions discussed. Patient with history of multiple abscesses in the past, plans to f/u with derm but has not done this yet.   IUD for contraception, states she is not pregnant.  ED return precautions discussed.  Final Clinical Impressions(s) / UC Diagnoses   Final diagnoses:  Abscess of left axilla  IUD contraception     Discharge Instructions      -Doxycycline twice daily for 7 days.  Make sure to wear sunscreen while taking this medication as it can increase your chance of sunburn. -Wash the wound with gentle soap and water twice daily.  You can follow this with a pressure dressing using nonstick gauze and tape. -A warm compress can help remaining pus come out -Seek additional medical attention if you develop new symptoms like fevers and chills, worsening of abscess despite treatment     ED Prescriptions     Medication Sig Dispense Auth. Provider   doxycycline (VIBRAMYCIN) 100 MG capsule Take 1 capsule (100 mg total) by mouth 2 (two) times daily for 7 days. 14 capsule Rhys Martini, PA-C      PDMP not reviewed this encounter.   Rhys Martini, PA-C 11/15/20 0906    Rhys Martini, PA-C 11/15/20 [phone]

## 2020-11-15 NOTE — Telephone Encounter (Signed)
Patient called in asking for an appointment regarding a cyst. She has not been seen in three years so she would have to reestablish care first. She was advised to go to urgent care.

## 2020-11-15 NOTE — ED Triage Notes (Signed)
C/O left axillary abscess onset approx 11 days ago with progressive worsening.  Denies fevers. Denies drainage.  Has hx abscesses.

## 2020-11-17 LAB — AEROBIC CULTURE W GRAM STAIN (SUPERFICIAL SPECIMEN)
